# Patient Record
Sex: Female | Born: 1985 | Race: White | Hispanic: No | Marital: Married | Smoking: Current some day smoker
Health system: Southern US, Community
[De-identification: ages and names within clinical notes are randomized; demographics above are authoritative.]

## PROBLEM LIST (undated history)

## (undated) DIAGNOSIS — F419 Anxiety disorder, unspecified: Secondary | ICD-10-CM

## (undated) DIAGNOSIS — K589 Irritable bowel syndrome without diarrhea: Secondary | ICD-10-CM

## (undated) HISTORY — PX: BACK SURGERY: SHX140

## (undated) HISTORY — PX: TONSILLECTOMY: SUR1361

---
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
## 2007-05-18 ENCOUNTER — Ambulatory Visit: Payer: Self-pay | Admitting: Pediatrics

## 2007-06-07 ENCOUNTER — Ambulatory Visit: Payer: Self-pay | Admitting: Pediatrics

## 2007-06-09 ENCOUNTER — Ambulatory Visit: Payer: Self-pay | Admitting: Pediatrics

## 2007-07-06 ENCOUNTER — Ambulatory Visit: Payer: Self-pay | Admitting: Pediatrics

## 2008-05-18 ENCOUNTER — Ambulatory Visit: Payer: Self-pay | Admitting: Pediatrics

## 2008-09-05 ENCOUNTER — Ambulatory Visit: Payer: Self-pay | Admitting: Internal Medicine

## 2008-11-02 ENCOUNTER — Ambulatory Visit: Payer: Self-pay | Admitting: Internal Medicine

## 2009-06-20 ENCOUNTER — Encounter: Payer: Self-pay | Admitting: Internal Medicine

## 2009-07-20 ENCOUNTER — Ambulatory Visit: Payer: Self-pay | Admitting: Internal Medicine

## 2009-07-20 DIAGNOSIS — R002 Palpitations: Secondary | ICD-10-CM | POA: Insufficient documentation

## 2009-07-23 ENCOUNTER — Ambulatory Visit: Payer: Self-pay

## 2009-07-25 ENCOUNTER — Encounter: Payer: Self-pay | Admitting: Internal Medicine

## 2009-07-25 ENCOUNTER — Ambulatory Visit: Payer: Self-pay

## 2009-07-27 ENCOUNTER — Telehealth: Payer: Self-pay | Admitting: Internal Medicine

## 2009-08-20 ENCOUNTER — Ambulatory Visit: Payer: Self-pay | Admitting: Internal Medicine

## 2010-03-25 ENCOUNTER — Emergency Department (HOSPITAL_COMMUNITY): Admission: EM | Admit: 2010-03-25 | Discharge: 2010-03-25 | Payer: Self-pay | Admitting: Family Medicine

## 2012-04-19 ENCOUNTER — Emergency Department (HOSPITAL_COMMUNITY): Payer: 59

## 2012-04-19 ENCOUNTER — Encounter (HOSPITAL_COMMUNITY): Payer: Self-pay | Admitting: *Deleted

## 2012-04-19 ENCOUNTER — Emergency Department (HOSPITAL_COMMUNITY)
Admission: EM | Admit: 2012-04-19 | Discharge: 2012-04-19 | Disposition: A | Discharge status: Home / Self Care | Payer: 59 | Attending: Emergency Medicine | Admitting: Emergency Medicine

## 2012-04-19 DIAGNOSIS — S060X1A Concussion with loss of consciousness of 30 minutes or less, initial encounter: Secondary | ICD-10-CM | POA: Insufficient documentation

## 2012-04-19 DIAGNOSIS — S060X0A Concussion without loss of consciousness, initial encounter: Secondary | ICD-10-CM

## 2012-04-19 DIAGNOSIS — H9209 Otalgia, unspecified ear: Secondary | ICD-10-CM | POA: Insufficient documentation

## 2012-04-19 DIAGNOSIS — R51 Headache: Secondary | ICD-10-CM | POA: Insufficient documentation

## 2012-04-19 DIAGNOSIS — R42 Dizziness and giddiness: Secondary | ICD-10-CM | POA: Insufficient documentation

## 2012-04-19 DIAGNOSIS — R55 Syncope and collapse: Secondary | ICD-10-CM

## 2012-04-19 DIAGNOSIS — R296 Repeated falls: Secondary | ICD-10-CM | POA: Insufficient documentation

## 2012-04-19 DIAGNOSIS — Z79899 Other long term (current) drug therapy: Secondary | ICD-10-CM | POA: Insufficient documentation

## 2012-04-19 DIAGNOSIS — M542 Cervicalgia: Secondary | ICD-10-CM | POA: Insufficient documentation

## 2012-04-19 LAB — CBC
HCT: 37.6 % (ref 36.0–46.0)
MCHC: 34.3 g/dL (ref 30.0–36.0)
MCV: 91 fL (ref 78.0–100.0)
Platelets: 334 10*3/uL (ref 150–400)
RDW: 12.3 % (ref 11.5–15.5)
WBC: 11.8 10*3/uL — ABNORMAL HIGH (ref 4.0–10.5)

## 2012-04-19 LAB — DIFFERENTIAL
Basophils Absolute: 0.1 10*3/uL (ref 0.0–0.1)
Basophils Relative: 1 % (ref 0–1)
Eosinophils Relative: 3 % (ref 0–5)
Monocytes Absolute: 0.9 10*3/uL (ref 0.1–1.0)
Neutro Abs: 5.7 10*3/uL (ref 1.7–7.7)

## 2012-04-19 LAB — POCT I-STAT, CHEM 8
Calcium, Ion: 1.22 mmol/L (ref 1.12–1.32)
HCT: 40 % (ref 36.0–46.0)
Sodium: 140 mEq/L (ref 135–145)
TCO2: 23 mmol/L (ref 0–100)

## 2012-04-19 NOTE — ED Notes (Signed)
Pt. Was standing in the driveway Friday and felt weak and passed out hitting her head on the car.  Pt. ahs c/o head and neck pain.  Pt. Reports feeling nauseated, weak, and light headed.  Pt. reports having headaches and having light sensitivity.

## 2012-04-19 NOTE — ED Provider Notes (Signed)
History     CSN: [id number]  Arrival date & time 04/19/12  1533   None     Chief Complaint  Patient presents with  . Loss of Consciousness  . Loss of Vision  . Fall  . Otalgia    (Consider location/radiation/quality/duration/timing/severity/associated sxs/prior treatment) HPI Comments: Patient states she had a syncopal episode on Friday.  That lasted several seconds, but she fell to the right side, hitting her.  Her head just behind the ear on her car and falling to the ground, landing on her right side.  The remainder of the weekend.  She is to spelled slightly off kilter without nausea.  No further episodes of syncope.  She states that for the past year.  She said several near syncopal episodes, where she's had a roaring in her ears, and developed slight tunnel vision, but these  resolved spontaneously.  She does take propanolol 10 mg daily, but this is not for cardiac reasons, but as a result of a reaction to a previous medication for depression, has been taking this for several years.  Other than having Harrington rods in her back for scoliosis.  She has no medical problems other than depression  Patient is a 26 y.o. female presenting with syncope, fall, and ear pain. The history is provided by the patient.  Loss of Consciousness This is a new problem. The current episode started in the past 7 days. The problem occurs constantly. The problem has been unchanged. Associated symptoms include headaches and neck pain. Pertinent negatives include no chills, fever, numbness, vertigo, visual change or weakness.  Fall Associated symptoms include headaches. Pertinent negatives include no visual change, no fever and no numbness.  Otalgia Associated symptoms include headaches and neck pain.    History reviewed. No pertinent past medical history.  History reviewed. No pertinent past surgical history.  History reviewed. No pertinent family history.  History  Substance Use Topics  . Smoking  status: Not on file  . Smokeless tobacco: Not on file  . Alcohol Use: No    OB History    Grav Para Term Preterm Abortions TAB SAB Ect Mult Living                  Review of Systems  Constitutional: Negative for fever and chills.  HENT: Positive for ear pain and neck pain. Negative for facial swelling and neck stiffness.   Cardiovascular: Positive for syncope.  Genitourinary: Negative for dysuria and menstrual problem.  Neurological: Positive for dizziness, syncope and headaches. Negative for vertigo, tremors, speech difficulty, weakness, light-headedness and numbness.    Allergies  Viibryd  Home Medications   Current Outpatient Rx  Name Route Sig Dispense Refill  . NORETHIN ACE-ETH ESTRAD-FE 1-20 MG-MCG PO TABS Oral Take 1 tablet by mouth at bedtime.    Marland Kitchen PANTOPRAZOLE SODIUM 40 MG PO TBEC Oral Take 40 mg by mouth daily.    Marland Kitchen PROPRANOLOL HCL 10 MG PO TABS Oral Take 10 mg by mouth daily.      BP 134/71  Pulse 61  Temp(Src) 98.3 F (36.8 C) (Oral)  Resp 16  SpO2 99%  LMP 04/16/2012  Physical Exam  Constitutional: She is oriented to person, place, and time. She appears well-developed.  HENT:  Head: Normocephalic and atraumatic.  Left Ear: External ear normal.  Nose: Nose normal.  Mouth/Throat: Oropharynx is clear and moist.       No bruising in the right mastoid area at the point of impact  Eyes:  Pupils are equal, round, and reactive to light.  Neck: Normal range of motion.  Cardiovascular: Normal rate.   Pulmonary/Chest: Effort normal.  Musculoskeletal: Normal range of motion.  Neurological: She is alert and oriented to person, place, and time.  Skin: Skin is warm and dry.    ED Course  Procedures (including critical care time)  Labs Reviewed  CBC - Abnormal; Notable for the following:    WBC 11.8 (*)    All other components within normal limits  DIFFERENTIAL - Abnormal; Notable for the following:    Lymphs Abs 4.8 (*)    All other components within  normal limits  POCT I-STAT, CHEM 8   Ct Head Wo Contrast  04/19/2012  *RADIOLOGY REPORT*  Clinical Data: Syncope, head trauma.  Neck pain.  CT HEAD WITHOUT CONTRAST,CT CERVICAL SPINE WITHOUT CONTRAST  Technique:  Contiguous axial images were obtained from the base of the skull through the vertex without contrast.,Technique: Multidetector CT imaging of the cervical spine was performed. Multiplanar CT image reconstructions were also generated.  Comparison: None.  Findings:  Head: There is no evidence for acute hemorrhage, hydrocephalus, mass lesion, or abnormal extra-axial fluid collection.  No definite CT evidence for acute infarction.  The visualized paranasal sinuses and mastoid air cells are predominately clear.  No displaced calvarial fracture.  Cervical spine:  Lung apices are clear.  Maintained craniocervical relationship.  Loss of normal cervical lordosis.  Maintained vertebral body height.  No prevertebral soft tissue swelling.  No aggressive osseous lesion.  Mild leftward curvature.  Maintained craniocervical relationship and C1-2 articulation. Harrington rods noted on the scout radiograph.  IMPRESSION: No acute intracranial abnormality.  Loss of normal cervical lordosis is likely positional or secondary to muscle spasm.  No acute cervical spine fracture or dislocation identified.  Original Report Authenticated By: Waneta Martins, M.D.   Ct Cervical Spine Wo Contrast  04/19/2012  *RADIOLOGY REPORT*  Clinical Data: Syncope, head trauma.  Neck pain.  CT HEAD WITHOUT CONTRAST,CT CERVICAL SPINE WITHOUT CONTRAST  Technique:  Contiguous axial images were obtained from the base of the skull through the vertex without contrast.,Technique: Multidetector CT imaging of the cervical spine was performed. Multiplanar CT image reconstructions were also generated.  Comparison: None.  Findings:  Head: There is no evidence for acute hemorrhage, hydrocephalus, mass lesion, or abnormal extra-axial fluid collection.   No definite CT evidence for acute infarction.  The visualized paranasal sinuses and mastoid air cells are predominately clear.  No displaced calvarial fracture.  Cervical spine:  Lung apices are clear.  Maintained craniocervical relationship.  Loss of normal cervical lordosis.  Maintained vertebral body height.  No prevertebral soft tissue swelling.  No aggressive osseous lesion.  Mild leftward curvature.  Maintained craniocervical relationship and C1-2 articulation. Harrington rods noted on the scout radiograph.  IMPRESSION: No acute intracranial abnormality.  Loss of normal cervical lordosis is likely positional or secondary to muscle spasm.  No acute cervical spine fracture or dislocation identified.  Original Report Authenticated By: Waneta Martins, M.D.     1. Head injury, closed, with concussion, initial encounter   2. Syncope       MDM  Syncopal episode, resulting in a minor head injury.  Will CT head and neck T2 develop enough, nausea, and feeling ataxic.  Today, she did contact her primary care physician, Dr. Andi Devon, who sent her to the emergency room for evaluation of the syncope, as well as the head injury.  I will also obtain C.  i-STAT, and EKG        Arman Filter, NP 04/19/12 2224

## 2012-04-19 NOTE — ED Notes (Signed)
D/c instructions reviewed w/ pt and family - pt and family deny any further questions or concerns at present. Pt ambulating independently w/ steady gait on d/c in no acute distress - A&Ox4.

## 2012-04-19 NOTE — ED Provider Notes (Signed)
Medical screening examination/treatment/procedure(s) were performed by non-physician practitioner and as supervising physician I was immediately available for consultation/collaboration.  Emmah Bratcher, MD 04/19/12 2322 

## 2012-04-19 NOTE — ED Notes (Signed)
Pt presents w/ HA and neck pain s/p syncopal episode on Friday to which pt hit her head on her car. Pt w/ nausea and light-sensitivity as well. Pt in no acute distress on assessment, A&Ox4 - pleasant and cooperative.

## 2012-04-19 NOTE — Discharge Instructions (Signed)
A head CT scan does not reveal any obvious brain injury or skull fractures, you or walker for syncope.  Is negative with normal electrolytes, and EKG, please followup with Dr. Clelia Croft.  Next week.  We have also been given instructions on concussion and brain injury as you do have a mild concussion as a result of her fall

## 2012-04-19 NOTE — ED Notes (Signed)
Patient transported to CT 

## 2013-04-27 ENCOUNTER — Other Ambulatory Visit: Payer: Self-pay | Admitting: Orthopedic Surgery

## 2013-04-27 DIAGNOSIS — R609 Edema, unspecified: Secondary | ICD-10-CM

## 2013-04-27 DIAGNOSIS — R531 Weakness: Secondary | ICD-10-CM

## 2013-04-27 DIAGNOSIS — M25572 Pain in left ankle and joints of left foot: Secondary | ICD-10-CM

## 2013-05-01 ENCOUNTER — Ambulatory Visit
Admission: RE | Admit: 2013-05-01 | Discharge: 2013-05-01 | Disposition: A | Discharge status: Home / Self Care | Payer: 59 | Source: Ambulatory Visit | Attending: Orthopedic Surgery | Admitting: Orthopedic Surgery

## 2013-05-01 DIAGNOSIS — R531 Weakness: Secondary | ICD-10-CM

## 2013-05-01 DIAGNOSIS — R609 Edema, unspecified: Secondary | ICD-10-CM

## 2013-05-01 DIAGNOSIS — M25572 Pain in left ankle and joints of left foot: Secondary | ICD-10-CM

## 2014-06-19 ENCOUNTER — Emergency Department (HOSPITAL_COMMUNITY): Payer: BC Managed Care – PPO

## 2014-06-19 ENCOUNTER — Encounter (HOSPITAL_COMMUNITY): Payer: Self-pay | Admitting: Emergency Medicine

## 2014-06-19 ENCOUNTER — Emergency Department (HOSPITAL_COMMUNITY)
Admission: EM | Admit: 2014-06-19 | Discharge: 2014-06-19 | Disposition: A | Discharge status: Home / Self Care | Payer: BC Managed Care – PPO | Attending: Emergency Medicine | Admitting: Emergency Medicine

## 2014-06-19 DIAGNOSIS — Z79899 Other long term (current) drug therapy: Secondary | ICD-10-CM | POA: Insufficient documentation

## 2014-06-19 DIAGNOSIS — Z792 Long term (current) use of antibiotics: Secondary | ICD-10-CM | POA: Insufficient documentation

## 2014-06-19 DIAGNOSIS — Z3202 Encounter for pregnancy test, result negative: Secondary | ICD-10-CM | POA: Insufficient documentation

## 2014-06-19 DIAGNOSIS — R1031 Right lower quadrant pain: Secondary | ICD-10-CM

## 2014-06-19 DIAGNOSIS — R110 Nausea: Secondary | ICD-10-CM | POA: Insufficient documentation

## 2014-06-19 DIAGNOSIS — K589 Irritable bowel syndrome without diarrhea: Secondary | ICD-10-CM | POA: Insufficient documentation

## 2014-06-19 DIAGNOSIS — Z8659 Personal history of other mental and behavioral disorders: Secondary | ICD-10-CM | POA: Insufficient documentation

## 2014-06-19 DIAGNOSIS — F17200 Nicotine dependence, unspecified, uncomplicated: Secondary | ICD-10-CM | POA: Insufficient documentation

## 2014-06-19 HISTORY — DX: Irritable bowel syndrome, unspecified: K58.9

## 2014-06-19 HISTORY — DX: Anxiety disorder, unspecified: F41.9

## 2014-06-19 LAB — COMPREHENSIVE METABOLIC PANEL
ALBUMIN: 4.6 g/dL (ref 3.5–5.2)
ALT: 12 U/L (ref 0–35)
ANION GAP: 15 (ref 5–15)
AST: 19 U/L (ref 0–37)
Alkaline Phosphatase: 68 U/L (ref 39–117)
BILIRUBIN TOTAL: 0.3 mg/dL (ref 0.3–1.2)
BUN: 16 mg/dL (ref 6–23)
CO2: 25 mEq/L (ref 19–32)
CREATININE: 0.9 mg/dL (ref 0.50–1.10)
Calcium: 10 mg/dL (ref 8.4–10.5)
Chloride: 98 mEq/L (ref 96–112)
GFR calc non Af Amer: 87 mL/min — ABNORMAL LOW (ref >90)
GLUCOSE: 100 mg/dL — AB (ref 70–99)
Potassium: 4.2 mEq/L (ref 3.7–5.3)
Sodium: 138 mEq/L (ref 137–147)
TOTAL PROTEIN: 8 g/dL (ref 6.0–8.3)

## 2014-06-19 LAB — URINALYSIS, ROUTINE W REFLEX MICROSCOPIC
Bilirubin Urine: NEGATIVE
Glucose, UA: NEGATIVE mg/dL
HGB URINE DIPSTICK: NEGATIVE
KETONES UR: NEGATIVE mg/dL
Leukocytes, UA: NEGATIVE
Nitrite: NEGATIVE
PROTEIN: NEGATIVE mg/dL
Specific Gravity, Urine: 1.008 (ref 1.005–1.030)
UROBILINOGEN UA: 0.2 mg/dL (ref 0.0–1.0)
pH: 6 (ref 5.0–8.0)

## 2014-06-19 LAB — CBC WITH DIFFERENTIAL/PLATELET
BASOS PCT: 1 % (ref 0–1)
Basophils Absolute: 0.1 10*3/uL (ref 0.0–0.1)
EOS ABS: 0.3 10*3/uL (ref 0.0–0.7)
EOS PCT: 2 % (ref 0–5)
HEMATOCRIT: 40.9 % (ref 36.0–46.0)
HEMOGLOBIN: 13.7 g/dL (ref 12.0–15.0)
LYMPHS ABS: 3.7 10*3/uL (ref 0.7–4.0)
Lymphocytes Relative: 28 % (ref 12–46)
MCH: 31.9 pg (ref 26.0–34.0)
MCHC: 33.5 g/dL (ref 30.0–36.0)
MCV: 95.3 fL (ref 78.0–100.0)
MONO ABS: 1.2 10*3/uL — AB (ref 0.1–1.0)
MONOS PCT: 9 % (ref 3–12)
Neutro Abs: 8 10*3/uL — ABNORMAL HIGH (ref 1.7–7.7)
Neutrophils Relative %: 60 % (ref 43–77)
Platelets: 349 10*3/uL (ref 150–400)
RBC: 4.29 MIL/uL (ref 3.87–5.11)
RDW: 13.1 % (ref 11.5–15.5)
WBC: 13.2 10*3/uL — ABNORMAL HIGH (ref 4.0–10.5)

## 2014-06-19 LAB — POC URINE PREG, ED: Preg Test, Ur: NEGATIVE

## 2014-06-19 LAB — LIPASE, BLOOD: LIPASE: 35 U/L (ref 11–59)

## 2014-06-19 NOTE — ED Notes (Signed)
Bed: WA09 Expected date:  Expected time:  Means of arrival:  Comments: EMS 

## 2014-06-19 NOTE — ED Provider Notes (Signed)
CSN: [id number]     Arrival date & time 06/19/14  1832 History   First MD Initiated Contact with Patient 06/19/14 2002     Chief Complaint  Patient presents with  . Abdominal Pain     (Consider location/radiation/quality/duration/timing/severity/associated sxs/prior Treatment) HPI Comments: Patient presents with abdominal pain. She states that she has not had a period in about 3 months. She saw her OB/GYN about 2-3 weeks ago and had a pelvic exam which she said was unremarkable. She was started on Prometrium to help restart her periods. She states that she still hasn't started.  She has a two-day history of some pain in her lower abdomen. She describes as a dull ache in her right lower abdomen. It radiates to her groin and into her back. It seems to come in waves. She has had some nausea but no vomiting. She denies any fevers. She denies any urinary symptoms. She takes an ibuprofen which did improve his symptoms but did not take them away.   Past Medical History  Diagnosis Date  . IBS (irritable bowel syndrome)   . Spastic colon   . Anxiety    Past Surgical History  Procedure Laterality Date  . Tonsillectomy    . Back surgery      scoliosis repair   No family history on file. History  Substance Use Topics  . Smoking status: Current Some Day Smoker    Types: Cigarettes  . Smokeless tobacco: Not on file  . Alcohol Use: Yes     Comment: socially   OB History   Grav Para Term Preterm Abortions TAB SAB Ect Mult Living                 Review of Systems  Constitutional: Negative for fever, chills, diaphoresis and fatigue.  HENT: Negative for congestion, rhinorrhea and sneezing.   Eyes: Negative.   Respiratory: Negative for cough, chest tightness and shortness of breath.   Cardiovascular: Negative for chest pain and leg swelling.  Gastrointestinal: Positive for nausea and abdominal pain. Negative for vomiting, diarrhea and blood in stool.  Genitourinary: Negative for frequency,  hematuria, flank pain, vaginal bleeding, vaginal discharge and difficulty urinating.  Musculoskeletal: Negative for arthralgias and back pain.  Skin: Negative for rash.  Neurological: Negative for dizziness, speech difficulty, weakness, numbness and headaches.      Allergies  Viibryd  Home Medications   Prior to Admission medications   Medication Sig Start Date End Date Taking? Authorizing Provider  amoxicillin (AMOXIL) 500 MG capsule Take 500 mg by mouth 3 (three) times daily.   Yes Historical Provider, MD  hyoscyamine (LEVSIN SL) 0.125 MG SL tablet Place 0.125-0.25 mg under the tongue every 4 (four) hours as needed for cramping.   Yes Historical Provider, MD  pantoprazole (PROTONIX) 20 MG tablet Take 20 mg by mouth daily.   Yes Historical Provider, MD  spironolactone (ALDACTONE) 100 MG tablet Take 100 mg by mouth daily. 06/13/14   Historical Provider, MD   BP 107/56  Pulse 59  Temp(Src) 98.4 F (36.9 C) (Oral)  Resp 18  Ht 5\' 7"  (1.702 m)  Wt 165 lb (74.844 kg)  BMI 25.84 kg/m2  SpO2 100%  LMP 03/13/2014 Physical Exam  Constitutional: She is oriented to person, place, and time. She appears well-developed and well-nourished.  HENT:  Head: Normocephalic and atraumatic.  Eyes: Pupils are equal, round, and reactive to light.  Neck: Normal range of motion. Neck supple.  Cardiovascular: Normal rate, regular rhythm and normal  heart sounds.   Pulmonary/Chest: Effort normal and breath sounds normal. No respiratory distress. She has no wheezes. She has no rales. She exhibits no tenderness.  Abdominal: Soft. Bowel sounds are normal. There is tenderness (mild tenderness to the right lower abdomen, more in the pelvic area. No specific pain over McBurney's point. No rebound no guarding. No peritoneal  signs.). There is no rebound and no guarding.  Genitourinary:  Positive right adnexal tenderness. No cervical motion tenderness. There is some dark red blood in in the vault. No active  bleeding. No discharge.  Musculoskeletal: Normal range of motion. She exhibits no edema.  Lymphadenopathy:    She has no cervical adenopathy.  Neurological: She is alert and oriented to person, place, and time.  Skin: Skin is warm and dry. No rash noted.  Psychiatric: She has a normal mood and affect.    ED Course  Procedures (including critical care time) Labs Review Results for orders placed during the hospital encounter of 06/19/14  CBC WITH DIFFERENTIAL      Result Value Ref Range   WBC 13.2 (*) 4.0 - 10.5 K/uL   RBC 4.29  3.87 - 5.11 MIL/uL   Hemoglobin 13.7  12.0 - 15.0 g/dL   HCT 16.1  09.6 - 04.5 %   MCV 95.3  78.0 - 100.0 fL   MCH 31.9  26.0 - 34.0 pg   MCHC 33.5  30.0 - 36.0 g/dL   RDW 40.9  81.1 - 91.4 %   Platelets 349  150 - 400 K/uL   Neutrophils Relative % 60  43 - 77 %   Neutro Abs 8.0 (*) 1.7 - 7.7 K/uL   Lymphocytes Relative 28  12 - 46 %   Lymphs Abs 3.7  0.7 - 4.0 K/uL   Monocytes Relative 9  3 - 12 %   Monocytes Absolute 1.2 (*) 0.1 - 1.0 K/uL   Eosinophils Relative 2  0 - 5 %   Eosinophils Absolute 0.3  0.0 - 0.7 K/uL   Basophils Relative 1  0 - 1 %   Basophils Absolute 0.1  0.0 - 0.1 K/uL  COMPREHENSIVE METABOLIC PANEL      Result Value Ref Range   Sodium 138  137 - 147 mEq/L   Potassium 4.2  3.7 - 5.3 mEq/L   Chloride 98  96 - 112 mEq/L   CO2 25  19 - 32 mEq/L   Glucose, Bld 100 (*) 70 - 99 mg/dL   BUN 16  6 - 23 mg/dL   Creatinine, Ser 7.82  0.50 - 1.10 mg/dL   Calcium 95.6  8.4 - 21.3 mg/dL   Total Protein 8.0  6.0 - 8.3 g/dL   Albumin 4.6  3.5 - 5.2 g/dL   AST 19  0 - 37 U/L   ALT 12  0 - 35 U/L   Alkaline Phosphatase 68  39 - 117 U/L   Total Bilirubin 0.3  0.3 - 1.2 mg/dL   GFR calc non Af Amer 87 (*) >90 mL/min   GFR calc Af Amer >90  >90 mL/min   Anion gap 15  5 - 15  LIPASE, BLOOD      Result Value Ref Range   Lipase 35  11 - 59 U/L  URINALYSIS, ROUTINE W REFLEX MICROSCOPIC      Result Value Ref Range   Color, Urine YELLOW   YELLOW   APPearance CLOUDY (*) CLEAR   Specific Gravity, Urine 1.008  1.005 - 1.030  pH 6.0  5.0 - 8.0   Glucose, UA NEGATIVE  NEGATIVE mg/dL   Hgb urine dipstick NEGATIVE  NEGATIVE   Bilirubin Urine NEGATIVE  NEGATIVE   Ketones, ur NEGATIVE  NEGATIVE mg/dL   Protein, ur NEGATIVE  NEGATIVE mg/dL   Urobilinogen, UA 0.2  0.0 - 1.0 mg/dL   Nitrite NEGATIVE  NEGATIVE   Leukocytes, UA NEGATIVE  NEGATIVE  POC URINE PREG, ED      Result Value Ref Range   Preg Test, Ur NEGATIVE  NEGATIVE   [place] Transvaginal Non-ob  06/19/2014   CLINICAL DATA:  Right lower quadrant abdominal pain. LMP 3 months ago. Negative pregnancy test.  EXAM: TRANSABDOMINAL AND TRANSVAGINAL ULTRASOUND OF PELVIS  DOPPLER ULTRASOUND OF OVARIES  TECHNIQUE: Both transabdominal and transvaginal ultrasound examinations of the pelvis were performed. Transabdominal technique was performed for global imaging of the pelvis including uterus, ovaries, adnexal regions, and pelvic cul-de-sac.  It was necessary to proceed with endovaginal exam following the transabdominal exam to visualize the endometrium and ovaries. Color and duplex Doppler ultrasound was utilized to evaluate blood flow to the ovaries.  COMPARISON:  None.  FINDINGS: Uterus  Measurements: 6.3 x 2.6 x 3.9 cm. No fibroids or other mass visualized.  Endometrium  Thickness: 3.4 mm.  No focal abnormality visualized.  Right ovary  Measurements: 4.5 x 1.5 x 1.9 cm. Multiple follicles noted without focal cyst or mass.  Left ovary  Measurements: 2.5 x 2.1 x 2.3 cm. Multiple follicles noted without focal cyst or mass.  Pulsed Doppler evaluation of both ovaries demonstrates normal low-resistance arterial and venous waveforms.  Other findings  No free fluid.  IMPRESSION: No acute findings.  No evidence of ovarian torsion.   Electronically Signed   By: Roxy HorsemanBill  Veazey M.D.   On: 06/19/2014 22:50   [place] Pelvis Complete  06/19/2014   CLINICAL DATA:  Right lower quadrant abdominal pain. LMP 3 months  ago. Negative pregnancy test.  EXAM: TRANSABDOMINAL AND TRANSVAGINAL ULTRASOUND OF PELVIS  DOPPLER ULTRASOUND OF OVARIES  TECHNIQUE: Both transabdominal and transvaginal ultrasound examinations of the pelvis were performed. Transabdominal technique was performed for global imaging of the pelvis including uterus, ovaries, adnexal regions, and pelvic cul-de-sac.  It was necessary to proceed with endovaginal exam following the transabdominal exam to visualize the endometrium and ovaries. Color and duplex Doppler ultrasound was utilized to evaluate blood flow to the ovaries.  COMPARISON:  None.  FINDINGS: Uterus  Measurements: 6.3 x 2.6 x 3.9 cm. No fibroids or other mass visualized.  Endometrium  Thickness: 3.4 mm.  No focal abnormality visualized.  Right ovary  Measurements: 4.5 x 1.5 x 1.9 cm. Multiple follicles noted without focal cyst or mass.  Left ovary  Measurements: 2.5 x 2.1 x 2.3 cm. Multiple follicles noted without focal cyst or mass.  Pulsed Doppler evaluation of both ovaries demonstrates normal low-resistance arterial and venous waveforms.  Other findings  No free fluid.  IMPRESSION: No acute findings.  No evidence of ovarian torsion.   Electronically Signed   By: Roxy HorsemanBill  Veazey M.D.   On: 06/19/2014 22:50   [place] Art/ven Flow Abd Pelv Doppler  06/19/2014   CLINICAL DATA:  Right lower quadrant abdominal pain. LMP 3 months ago. Negative pregnancy test.  EXAM: TRANSABDOMINAL AND TRANSVAGINAL ULTRASOUND OF PELVIS  DOPPLER ULTRASOUND OF OVARIES  TECHNIQUE: Both transabdominal and transvaginal ultrasound examinations of the pelvis were performed. Transabdominal technique was performed for global imaging of the pelvis including uterus, ovaries, adnexal regions, and pelvic cul-de-sac.  It was necessary to proceed with endovaginal exam following the transabdominal exam to visualize the endometrium and ovaries. Color and duplex Doppler ultrasound was utilized to evaluate blood flow to the ovaries.  COMPARISON:  None.   FINDINGS: Uterus  Measurements: 6.3 x 2.6 x 3.9 cm. No fibroids or other mass visualized.  Endometrium  Thickness: 3.4 mm.  No focal abnormality visualized.  Right ovary  Measurements: 4.5 x 1.5 x 1.9 cm. Multiple follicles noted without focal cyst or mass.  Left ovary  Measurements: 2.5 x 2.1 x 2.3 cm. Multiple follicles noted without focal cyst or mass.  Pulsed Doppler evaluation of both ovaries demonstrates normal low-resistance arterial and venous waveforms.  Other findings  No free fluid.  IMPRESSION: No acute findings.  No evidence of ovarian torsion.   Electronically Signed   By: Roxy Horseman M.D.   On: 06/19/2014 22:50      Imaging Review US Transvaginal Non-ob  06/19/2014   CLINICAL DATA:  Right lower quadrant abdominal pain. LMP 3 months ago. Negative pregnancy test.  EXAM: TRANSABDOMINAL AND TRANSVAGINAL ULTRASOUND OF PELVIS  DOPPLER ULTRASOUND OF OVARIES  TECHNIQUE: Both transabdominal and transvaginal ultrasound examinations of the pelvis were performed. Transabdominal technique was performed for global imaging of the pelvis including uterus, ovaries, adnexal regions, and pelvic cul-de-sac.  It was necessary to proceed with endovaginal exam following the transabdominal exam to visualize the endometrium and ovaries. Color and duplex Doppler ultrasound was utilized to evaluate blood flow to the ovaries.  COMPARISON:  None.  FINDINGS: Uterus  Measurements: 6.3 x 2.6 x 3.9 cm. No fibroids or other mass visualized.  Endometrium  Thickness: 3.4 mm.  No focal abnormality visualized.  Right ovary  Measurements: 4.5 x 1.5 x 1.9 cm. Multiple follicles noted without focal cyst or mass.  Left ovary  Measurements: 2.5 x 2.1 x 2.3 cm. Multiple follicles noted without focal cyst or mass.  Pulsed Doppler evaluation of both ovaries demonstrates normal low-resistance arterial and venous waveforms.  Other findings  No free fluid.  IMPRESSION: No acute findings.  No evidence of ovarian torsion.   Electronically  Signed   By: Roxy Horseman M.D.   On: 06/19/2014 22:50   [place] Pelvis Complete  06/19/2014   CLINICAL DATA:  Right lower quadrant abdominal pain. LMP 3 months ago. Negative pregnancy test.  EXAM: TRANSABDOMINAL AND TRANSVAGINAL ULTRASOUND OF PELVIS  DOPPLER ULTRASOUND OF OVARIES  TECHNIQUE: Both transabdominal and transvaginal ultrasound examinations of the pelvis were performed. Transabdominal technique was performed for global imaging of the pelvis including uterus, ovaries, adnexal regions, and pelvic cul-de-sac.  It was necessary to proceed with endovaginal exam following the transabdominal exam to visualize the endometrium and ovaries. Color and duplex Doppler ultrasound was utilized to evaluate blood flow to the ovaries.  COMPARISON:  None.  FINDINGS: Uterus  Measurements: 6.3 x 2.6 x 3.9 cm. No fibroids or other mass visualized.  Endometrium  Thickness: 3.4 mm.  No focal abnormality visualized.  Right ovary  Measurements: 4.5 x 1.5 x 1.9 cm. Multiple follicles noted without focal cyst or mass.  Left ovary  Measurements: 2.5 x 2.1 x 2.3 cm. Multiple follicles noted without focal cyst or mass.  Pulsed Doppler evaluation of both ovaries demonstrates normal low-resistance arterial and venous waveforms.  Other findings  No free fluid.  IMPRESSION: No acute findings.  No evidence of ovarian torsion.   Electronically Signed   By: Roxy Horseman M.D.   On: 06/19/2014 22:50   [place] Art/ven Flow  Abd Pelv Doppler  06/19/2014   CLINICAL DATA:  Right lower quadrant abdominal pain. LMP 3 months ago. Negative pregnancy test.  EXAM: TRANSABDOMINAL AND TRANSVAGINAL ULTRASOUND OF PELVIS  DOPPLER ULTRASOUND OF OVARIES  TECHNIQUE: Both transabdominal and transvaginal ultrasound examinations of the pelvis were performed. Transabdominal technique was performed for global imaging of the pelvis including uterus, ovaries, adnexal regions, and pelvic cul-de-sac.  It was necessary to proceed with endovaginal exam following the  transabdominal exam to visualize the endometrium and ovaries. Color and duplex Doppler ultrasound was utilized to evaluate blood flow to the ovaries.  COMPARISON:  None.  FINDINGS: Uterus  Measurements: 6.3 x 2.6 x 3.9 cm. No fibroids or other mass visualized.  Endometrium  Thickness: 3.4 mm.  No focal abnormality visualized.  Right ovary  Measurements: 4.5 x 1.5 x 1.9 cm. Multiple follicles noted without focal cyst or mass.  Left ovary  Measurements: 2.5 x 2.1 x 2.3 cm. Multiple follicles noted without focal cyst or mass.  Pulsed Doppler evaluation of both ovaries demonstrates normal low-resistance arterial and venous waveforms.  Other findings  No free fluid.  IMPRESSION: No acute findings.  No evidence of ovarian torsion.   Electronically Signed   By: Roxy Horseman M.D.   On: 06/19/2014 22:50     EKG Interpretation None      MDM   Final diagnoses:  Right lower quadrant abdominal pain    Patient presents with right lower quadrant pain. Her pain seems to be more in the pelvic area than around her appendix. She also has started having some vaginal bleeding. I feel her pain is more likely due to this. I have a lower suspicion for appendicitis. However she does have a mildly elevated white count. She is well-appearing with no peritoneal signs. I did discuss options of doing a CT scan to evaluate her appendix versus 24-hour period of observation. I did advise her the risk and benefits including delayed evaluation of appendicitis and possible rupture versus the radiation exposure due to CT scans. She discussed this with her parents and is deciding to do a 24-hour period of observation. I advised her to followup with her primary care physician tomorrow to have someone reevaluate her abdomen 24 hours. Advised to return here if she has worsening pain vomiting or fevers.    Rolan Bucco, MD 06/19/14 [phone]

## 2014-06-19 NOTE — Discharge Instructions (Signed)
Abdominal Pain, Women °Abdominal (stomach, pelvic, or belly) pain can be caused by many things. It is important to tell your doctor: °· The location of the pain. °· Does it come and go or is it present all the time? °· Are there things that start the pain (eating certain foods, exercise)? °· Are there other symptoms associated with the pain (fever, nausea, vomiting, diarrhea)? °All of this is helpful to know when trying to find the cause of the pain. °CAUSES  °· Stomach: virus or bacteria infection, or ulcer. °· Intestine: appendicitis (inflamed appendix), regional ileitis (Crohn's disease), ulcerative colitis (inflamed colon), irritable bowel syndrome, diverticulitis (inflamed diverticulum of the colon), or cancer of the stomach or intestine. °· Gallbladder disease or stones in the gallbladder. °· Kidney disease, kidney stones, or infection. °· Pancreas infection or cancer. °· Fibromyalgia (pain disorder). °· Diseases of the female organs: °¨ Uterus: fibroid (non-cancerous) tumors or infection. °¨ Fallopian tubes: infection or tubal pregnancy. °¨ Ovary: cysts or tumors. °¨ Pelvic adhesions (scar tissue). °¨ Endometriosis (uterus lining tissue growing in the pelvis and on the pelvic organs). °¨ Pelvic congestion syndrome (female organs filling up with blood just before the menstrual period). °¨ Pain with the menstrual period. °¨ Pain with ovulation (producing an egg). °¨ Pain with an IUD (intrauterine device, birth control) in the uterus. °¨ Cancer of the female organs. °· Functional pain (pain not caused by a disease, may improve without treatment). °· Psychological pain. °· Depression. °DIAGNOSIS  °Your doctor will decide the seriousness of your pain by doing an examination. °· Blood tests. °· X-rays. °· Ultrasound. °· CT scan (computed tomography, special type of X-ray). °· MRI (magnetic resonance imaging). °· Cultures, for infection. °· Barium enema (dye inserted in the large intestine, to better view it with  X-rays). °· Colonoscopy (looking in intestine with a lighted tube). °· Laparoscopy (minor surgery, looking in abdomen with a lighted tube). °· Major abdominal exploratory surgery (looking in abdomen with a large incision). °TREATMENT  °The treatment will depend on the cause of the pain.  °· Many cases can be observed and treated at home. °· Over-the-counter medicines recommended by your caregiver. °· Prescription medicine. °· Antibiotics, for infection. °· Birth control pills, for painful periods or for ovulation pain. °· Hormone treatment, for endometriosis. °· Nerve blocking injections. °· Physical therapy. °· Antidepressants. °· Counseling with a psychologist or psychiatrist. °· Minor or major surgery. °HOME CARE INSTRUCTIONS  °· Do not take laxatives, unless directed by your caregiver. °· Take over-the-counter pain medicine only if ordered by your caregiver. Do not take aspirin because it can cause an upset stomach or bleeding. °· Try a clear liquid diet (broth or water) as ordered by your caregiver. Slowly move to a bland diet, as tolerated, if the pain is related to the stomach or intestine. °· Have a thermometer and take your temperature several times a day, and record it. °· Bed rest and sleep, if it helps the pain. °· Avoid sexual intercourse, if it causes pain. °· Avoid stressful situations. °· Keep your follow-up appointments and tests, as your caregiver orders. °· If the pain does not go away with medicine or surgery, you may try: °¨ Acupuncture. °¨ Relaxation exercises (yoga, meditation). °¨ Group therapy. °¨ Counseling. °SEEK MEDICAL CARE IF:  °· You notice certain foods cause stomach pain. °· Your home care treatment is not helping your pain. °· You need stronger pain medicine. °· You want your IUD removed. °· You feel faint or   lightheaded. °· You develop nausea and vomiting. °· You develop a rash. °· You are having side effects or an allergy to your medicine. °SEEK IMMEDIATE MEDICAL CARE IF:  °· Your  pain does not go away or gets worse. °· You have a fever. °· Your pain is felt only in portions of the abdomen. The right side could possibly be appendicitis. The left lower portion of the abdomen could be colitis or diverticulitis. °· You are passing blood in your stools (bright red or black tarry stools, with or without vomiting). °· You have blood in your urine. °· You develop chills, with or without a fever. °· You pass out. °MAKE SURE YOU:  °· Understand these instructions. °· Will watch your condition. °· Will get help right away if you are not doing well or get worse. °Document Released: 09/14/2007 Document Revised: 02/09/2012 Document Reviewed: 10/04/2009 °[employer]® Patient Information ©2015 [employer]. This information is not intended to replace advice given to you by your health care provider. Make sure you discuss any questions you have with your health care provider. ° °

## 2014-06-19 NOTE — ED Notes (Signed)
Pt states that she began having a dull ache to her Rt mid and lower quad yesterday am; pt states that it has progressively gotten worse and now describes it as constant cramping with intermittent sharp pains; pt c/o nausea no vomiting; pt states that she has taken her "colon" medicine and ibuprofen with no improvement; pt states that the pain is causing her increased anxiety as well

## 2014-06-19 NOTE — ED Notes (Signed)
Pt states that she has not had her period in 3 months and saw her OB / GYN 2-3 weeks ago; pt states that the work up was negative ans was placed on Prometrium to restart periods; pt has taken Prometrium for 2 weeks but has not started her period as of yet.

## 2015-07-31 ENCOUNTER — Other Ambulatory Visit: Payer: Self-pay | Admitting: Occupational Medicine

## 2015-07-31 ENCOUNTER — Ambulatory Visit: Payer: Self-pay

## 2015-07-31 DIAGNOSIS — M79672 Pain in left foot: Secondary | ICD-10-CM

## 2016-10-09 ENCOUNTER — Other Ambulatory Visit: Payer: Self-pay | Admitting: Family Medicine

## 2016-10-09 DIAGNOSIS — R42 Dizziness and giddiness: Secondary | ICD-10-CM

## 2016-10-09 DIAGNOSIS — R200 Anesthesia of skin: Secondary | ICD-10-CM

## 2016-10-15 ENCOUNTER — Ambulatory Visit
Admission: RE | Admit: 2016-10-15 | Discharge: 2016-10-15 | Disposition: A | Discharge status: Home / Self Care | Payer: 59 | Source: Ambulatory Visit | Attending: Family Medicine | Admitting: Family Medicine

## 2016-10-15 ENCOUNTER — Other Ambulatory Visit: Payer: Self-pay | Admitting: Family Medicine

## 2016-10-15 DIAGNOSIS — R200 Anesthesia of skin: Secondary | ICD-10-CM

## 2016-10-15 DIAGNOSIS — R42 Dizziness and giddiness: Secondary | ICD-10-CM

## 2016-10-15 MED ORDER — IOPAMIDOL (ISOVUE-300) INJECTION 61%: 75.0000 mL | Freq: Once | INTRAVENOUS | Status: DC | PRN

## 2016-12-09 DIAGNOSIS — L709 Acne, unspecified: Secondary | ICD-10-CM | POA: Diagnosis not present

## 2016-12-09 DIAGNOSIS — R87610 Atypical squamous cells of undetermined significance on cytologic smear of cervix (ASC-US): Secondary | ICD-10-CM | POA: Diagnosis not present

## 2017-02-02 DIAGNOSIS — L700 Acne vulgaris: Secondary | ICD-10-CM | POA: Diagnosis not present

## 2017-03-24 DIAGNOSIS — H66002 Acute suppurative otitis media without spontaneous rupture of ear drum, left ear: Secondary | ICD-10-CM | POA: Diagnosis not present

## 2017-03-27 DIAGNOSIS — H1045 Other chronic allergic conjunctivitis: Secondary | ICD-10-CM | POA: Diagnosis not present

## 2017-04-26 IMAGING — CT CT HEAD W/O CM
1 series · 16 of 30 positions shown, 20 images · non-contrast
Comparison: Head CT scan 04/19/2012.

CLINICAL DATA: Dizziness, anxiety and intermittent headaches for 3
weeks.

EXAM:
CT HEAD WITHOUT CONTRAST
TECHNIQUE: Contiguous axial images were obtained from the base of the skull
through the vertex without intravenous contrast.

[Series 2: head w/(date) · axial · 0.43mm/px · z∈[-261,-126]mm · 16 of 31 slices shown, 20 images]
[im 2/31  brain]
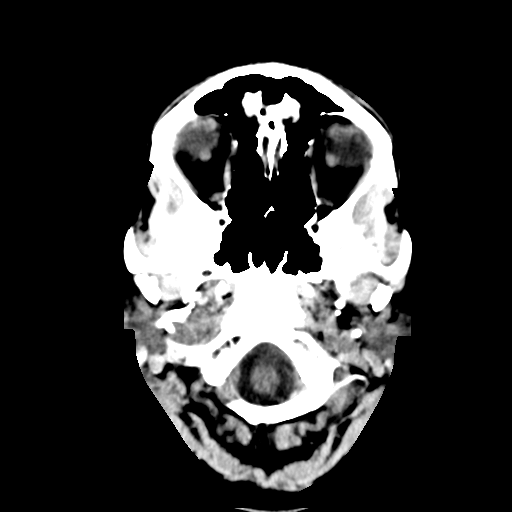
[im 2/31  bone]
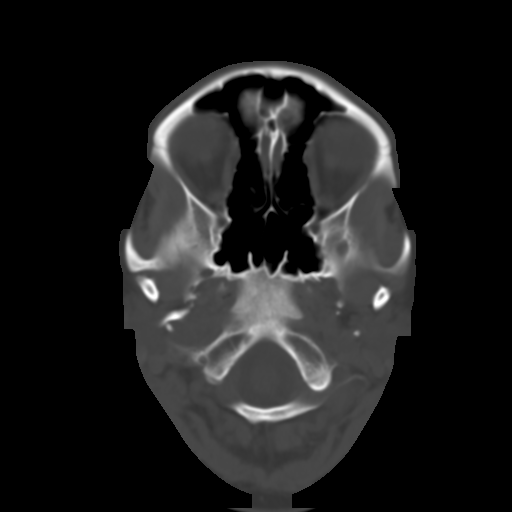
[im 4/31  brain]
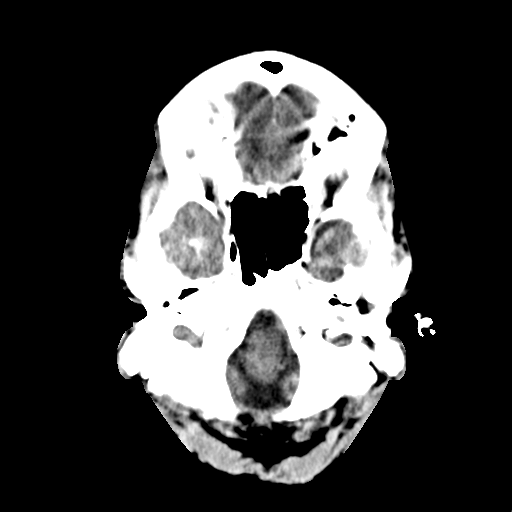
[im 6/31  brain]
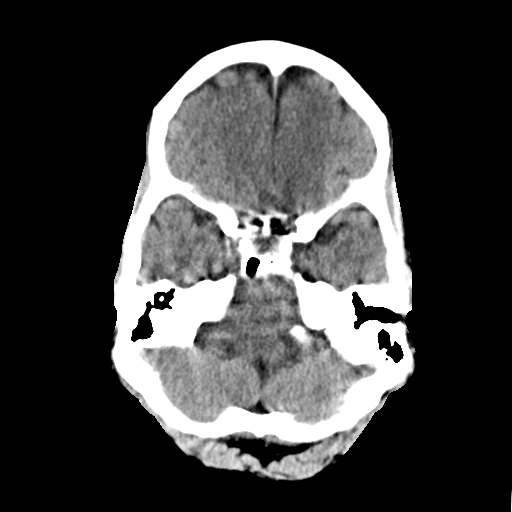
[im 8/31  brain]
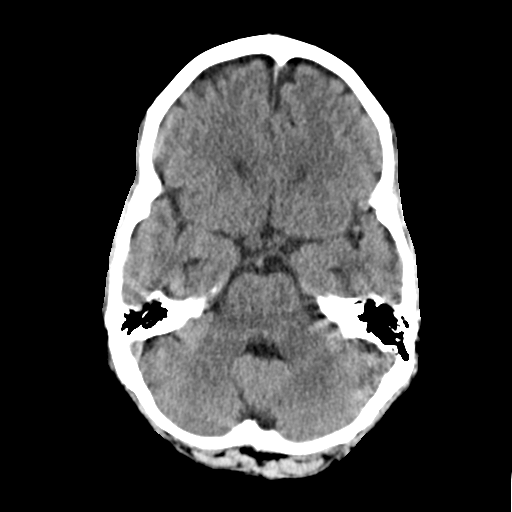
[im 9/31  brain]
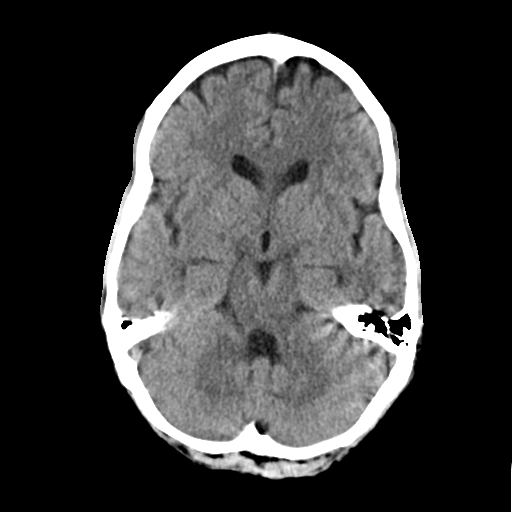
[im 9/31  bone]
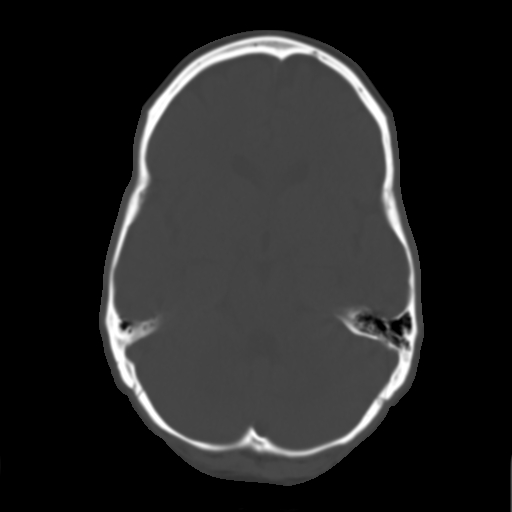
[im 11/31  brain]
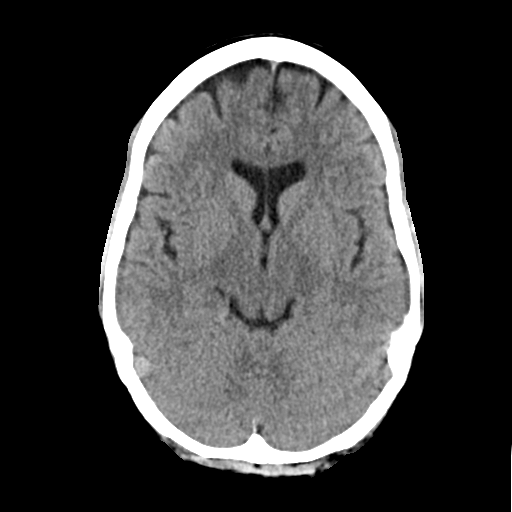
[im 13/31  brain]
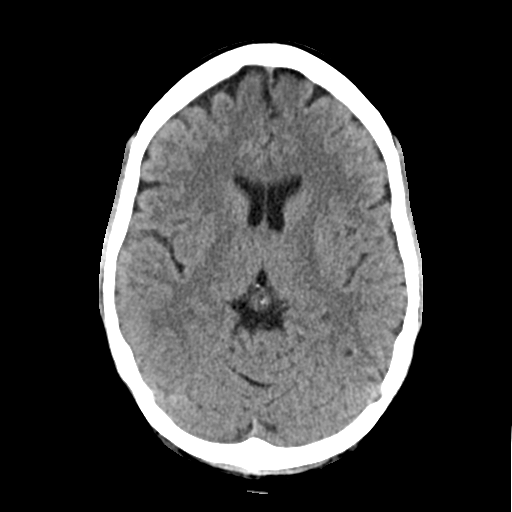
[im 15/31  brain]
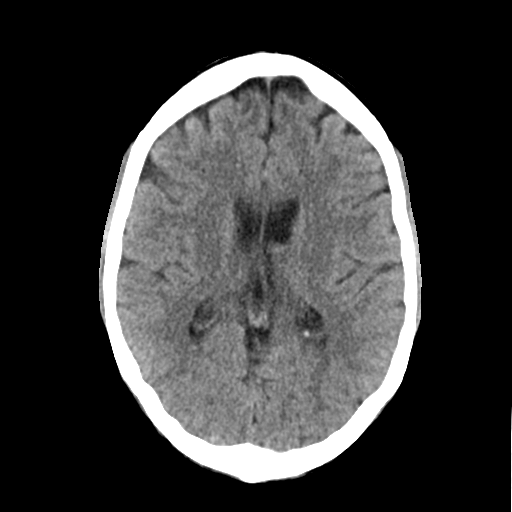
[im 16/31  brain]
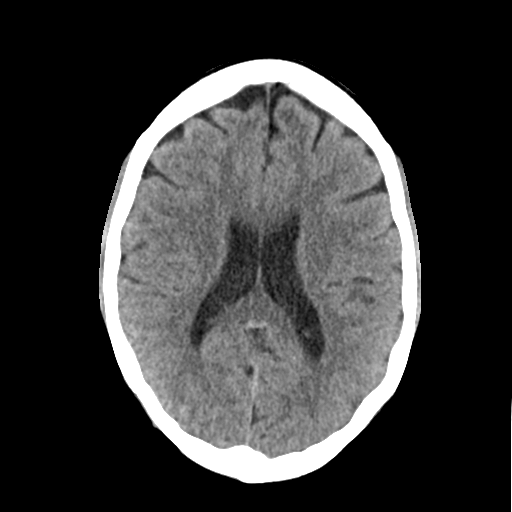
[im 16/31  bone]
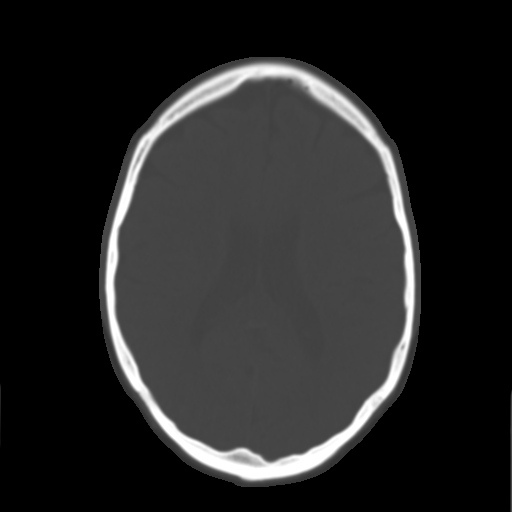
[im 18/31  brain]
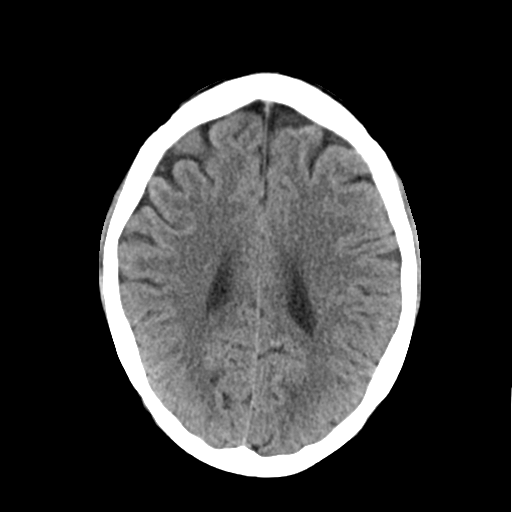
[im 20/31  brain]
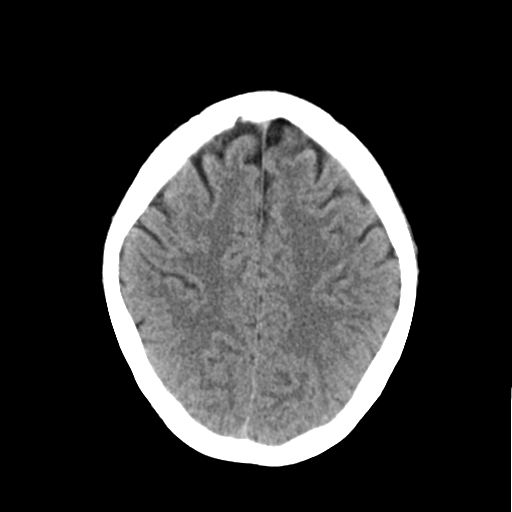
[im 22/31  brain]
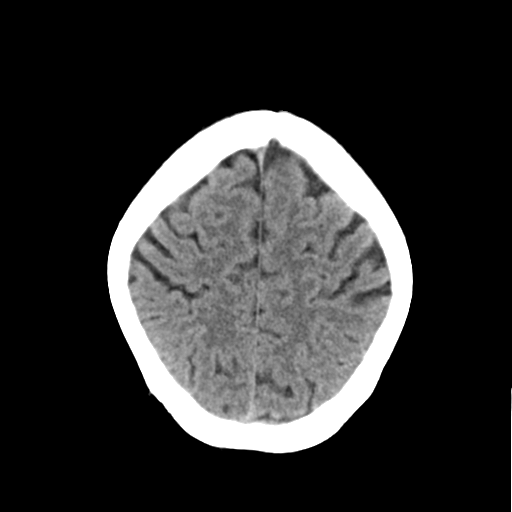
[im 23/31  brain]
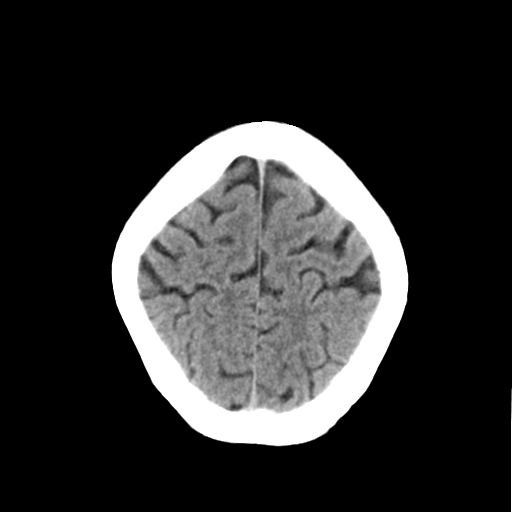
[im 23/31  bone]
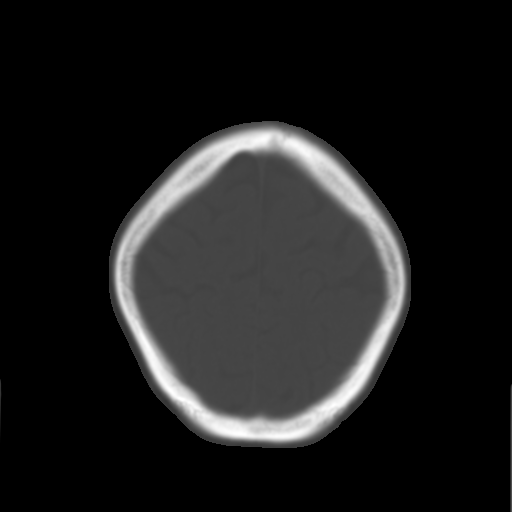
[im 25/31  brain]
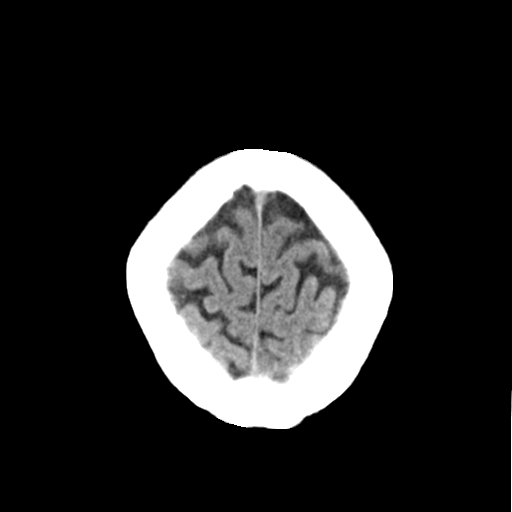
[im 27/31  brain]
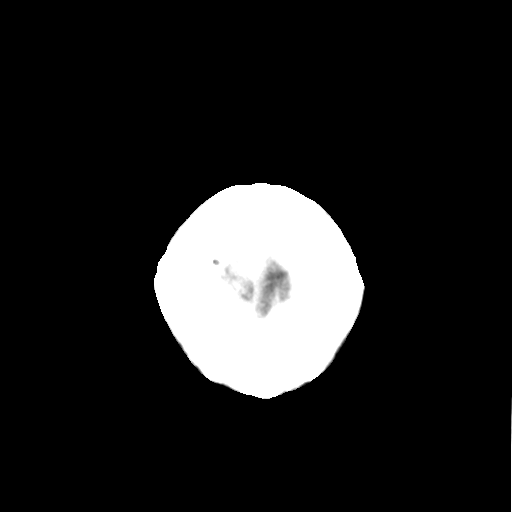
[im 29/31  brain]
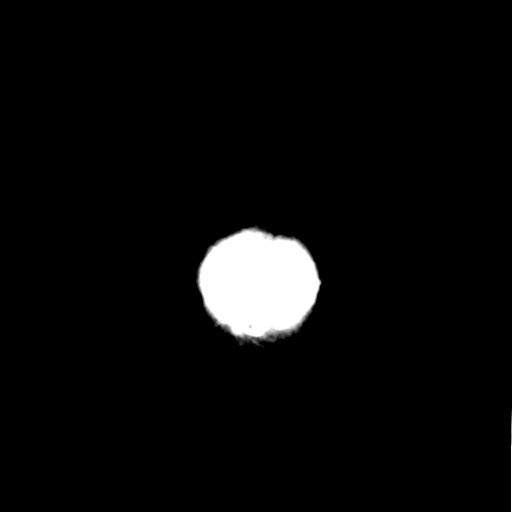

[16 of 30 positions shown; findings below may reference images not displayed]

FINDINGS: Brain: Appears normal without hemorrhage, infarct, mass lesion, mass
effect, midline shift or abnormal extra-axial fluid collection. No
hydrocephalus or pneumocephalus.

Vascular: Unremarkable.

Skull: Intact.

Sinuses/Orbits: Negative.

Other: None.
IMPRESSION: Normal head CT.

## 2017-08-17 DIAGNOSIS — Z01419 Encounter for gynecological examination (general) (routine) without abnormal findings: Secondary | ICD-10-CM | POA: Diagnosis not present

## 2017-09-02 DIAGNOSIS — N915 Oligomenorrhea, unspecified: Secondary | ICD-10-CM | POA: Diagnosis not present

## 2017-09-17 DIAGNOSIS — Z23 Encounter for immunization: Secondary | ICD-10-CM | POA: Diagnosis not present

## 2017-12-04 DIAGNOSIS — Z23 Encounter for immunization: Secondary | ICD-10-CM | POA: Diagnosis not present

## 2017-12-04 DIAGNOSIS — Z0000 Encounter for general adult medical examination without abnormal findings: Secondary | ICD-10-CM | POA: Diagnosis not present

## 2017-12-04 DIAGNOSIS — Z79899 Other long term (current) drug therapy: Secondary | ICD-10-CM | POA: Diagnosis not present

## 2018-07-20 DIAGNOSIS — H6091 Unspecified otitis externa, right ear: Secondary | ICD-10-CM | POA: Diagnosis not present

## 2018-08-24 DIAGNOSIS — Z23 Encounter for immunization: Secondary | ICD-10-CM | POA: Diagnosis not present

## 2018-09-02 DIAGNOSIS — Z01419 Encounter for gynecological examination (general) (routine) without abnormal findings: Secondary | ICD-10-CM | POA: Diagnosis not present

## 2018-09-02 DIAGNOSIS — Z6824 Body mass index (BMI) 24.0-24.9, adult: Secondary | ICD-10-CM | POA: Diagnosis not present

## 2018-09-15 DIAGNOSIS — R42 Dizziness and giddiness: Secondary | ICD-10-CM | POA: Diagnosis not present

## 2019-11-28 ENCOUNTER — Ambulatory Visit: Payer: 59 | Attending: Internal Medicine

## 2019-11-28 DIAGNOSIS — Z20822 Contact with and (suspected) exposure to covid-19: Secondary | ICD-10-CM

## 2019-11-30 LAB — NOVEL CORONAVIRUS, NAA: SARS-CoV-2, NAA: NOT DETECTED

## 2022-02-18 ENCOUNTER — Ambulatory Visit
Admission: RE | Admit: 2022-02-18 | Discharge: 2022-02-18 | Disposition: A | Discharge status: Home / Self Care | Payer: BC Managed Care – PPO | Source: Ambulatory Visit | Attending: Family Medicine | Admitting: Family Medicine

## 2022-02-18 ENCOUNTER — Other Ambulatory Visit: Payer: Self-pay | Admitting: Family Medicine

## 2022-02-18 DIAGNOSIS — M542 Cervicalgia: Secondary | ICD-10-CM

## 2022-12-17 ENCOUNTER — Other Ambulatory Visit (HOSPITAL_COMMUNITY): Payer: Self-pay

## 2022-12-17 MED ORDER — LISDEXAMFETAMINE DIMESYLATE 50 MG PO CAPS
50.0000 mg | ORAL_CAPSULE | Freq: Every morning | ORAL | 0 refills | Status: DC
  Filled 2022-12-17: qty 30, 30d supply, fill #0

## 2022-12-18 ENCOUNTER — Other Ambulatory Visit (HOSPITAL_COMMUNITY): Payer: Self-pay

## 2023-01-16 ENCOUNTER — Other Ambulatory Visit (HOSPITAL_COMMUNITY): Payer: Self-pay

## 2023-01-16 MED ORDER — LISDEXAMFETAMINE DIMESYLATE 50 MG PO CAPS
50.0000 mg | ORAL_CAPSULE | Freq: Every morning | ORAL | 0 refills | Status: DC
  Filled 2023-01-16: qty 30, 30d supply, fill #0

## 2023-01-19 ENCOUNTER — Other Ambulatory Visit (HOSPITAL_COMMUNITY): Payer: Self-pay

## 2023-01-26 ENCOUNTER — Other Ambulatory Visit (HOSPITAL_COMMUNITY): Payer: Self-pay

## 2023-01-26 MED ORDER — LISDEXAMFETAMINE DIMESYLATE 50 MG PO CAPS
50.0000 mg | ORAL_CAPSULE | ORAL | 0 refills | Status: AC
  Filled 2023-03-22: qty 30, 30d supply, fill #0

## 2023-01-26 MED ORDER — LISDEXAMFETAMINE DIMESYLATE 50 MG PO CAPS
50.0000 mg | ORAL_CAPSULE | Freq: Every morning | ORAL | 0 refills | Status: AC
  Filled 2023-06-18: qty 30, 30d supply, fill #0

## 2023-01-26 MED ORDER — LISDEXAMFETAMINE DIMESYLATE 50 MG PO CAPS
50.0000 mg | ORAL_CAPSULE | Freq: Every morning | ORAL | 0 refills | Status: AC
  Filled 2023-01-26 – 2023-02-18 (×2): qty 30, 30d supply, fill #0

## 2023-01-26 MED ORDER — TRINTELLIX 20 MG PO TABS
20.0000 mg | ORAL_TABLET | Freq: Every morning | ORAL | 1 refills | Status: DC
  Filled 2023-01-26 – 2023-02-27 (×2): qty 90, 90d supply, fill #0
  Filled 2023-05-29: qty 90, 90d supply, fill #1

## 2023-02-18 ENCOUNTER — Other Ambulatory Visit (HOSPITAL_COMMUNITY): Payer: Self-pay

## 2023-02-19 ENCOUNTER — Other Ambulatory Visit (HOSPITAL_COMMUNITY): Payer: Self-pay

## 2023-02-27 ENCOUNTER — Other Ambulatory Visit (HOSPITAL_COMMUNITY): Payer: Self-pay

## 2023-03-23 ENCOUNTER — Other Ambulatory Visit (HOSPITAL_COMMUNITY): Payer: Self-pay

## 2023-04-20 ENCOUNTER — Other Ambulatory Visit (HOSPITAL_COMMUNITY): Payer: Self-pay

## 2023-04-20 MED ORDER — LISDEXAMFETAMINE DIMESYLATE 50 MG PO CAPS
50.0000 mg | ORAL_CAPSULE | Freq: Every morning | ORAL | 0 refills | Status: AC
  Filled 2023-05-20: qty 30, 30d supply, fill #0

## 2023-04-20 MED ORDER — LISDEXAMFETAMINE DIMESYLATE 50 MG PO CAPS
50.0000 mg | ORAL_CAPSULE | Freq: Every morning | ORAL | 0 refills | Status: AC
  Filled 2023-08-11 – 2023-08-23 (×2): qty 30, 30d supply, fill #0

## 2023-04-20 MED ORDER — LISDEXAMFETAMINE DIMESYLATE 50 MG PO CAPS
50.0000 mg | ORAL_CAPSULE | Freq: Every morning | ORAL | 0 refills | Status: AC
  Filled 2023-04-20: qty 30, 30d supply, fill #0

## 2023-04-20 MED ORDER — TRINTELLIX 20 MG PO TABS
20.0000 mg | ORAL_TABLET | Freq: Every morning | ORAL | 1 refills | Status: DC
  Filled 2023-04-20: qty 90, 90d supply, fill #0

## 2023-04-22 ENCOUNTER — Other Ambulatory Visit (HOSPITAL_COMMUNITY): Payer: Self-pay

## 2023-05-12 ENCOUNTER — Other Ambulatory Visit (INDEPENDENT_AMBULATORY_CARE_PROVIDER_SITE_OTHER): Payer: BC Managed Care – PPO

## 2023-05-12 ENCOUNTER — Other Ambulatory Visit: Payer: Self-pay

## 2023-05-12 DIAGNOSIS — E282 Polycystic ovarian syndrome: Secondary | ICD-10-CM

## 2023-05-12 LAB — TSH: TSH: 1.12 u[IU]/mL (ref 0.35–5.50)

## 2023-05-12 LAB — COMPREHENSIVE METABOLIC PANEL
ALT: 9 U/L (ref 0–35)
AST: 13 U/L (ref 0–37)
Albumin: 4.3 g/dL (ref 3.5–5.2)
Alkaline Phosphatase: 43 U/L (ref 39–117)
BUN: 13 mg/dL (ref 6–23)
CO2: 27 mEq/L (ref 19–32)
Calcium: 9.4 mg/dL (ref 8.4–10.5)
Chloride: 101 mEq/L (ref 96–112)
Creatinine, Ser: 0.87 mg/dL (ref 0.40–1.20)
GFR: 86.2 mL/min (ref >60.00)
Glucose, Bld: 93 mg/dL (ref 70–99)
Potassium: 4.8 mEq/L (ref 3.5–5.1)
Sodium: 136 mEq/L (ref 135–145)
Total Bilirubin: 0.5 mg/dL (ref 0.2–1.2)
Total Protein: 6.8 g/dL (ref 6.0–8.3)

## 2023-05-12 LAB — LIPID PANEL
Cholesterol: 214 mg/dL — ABNORMAL HIGH (ref 0–200)
HDL: 89.3 mg/dL (ref >39.00)
LDL Cholesterol: 94 mg/dL (ref 0–99)
NonHDL: 125.15
Total CHOL/HDL Ratio: 2
Triglycerides: 154 mg/dL — ABNORMAL HIGH (ref 0.0–149.0)
VLDL: 30.8 mg/dL (ref 0.0–40.0)

## 2023-05-12 LAB — T4, FREE: Free T4: 0.79 ng/dL (ref 0.60–1.60)

## 2023-05-12 LAB — HEMOGLOBIN A1C: Hgb A1c MFr Bld: 5.5 % (ref 4.6–6.5)

## 2023-05-13 LAB — INSULIN, RANDOM: Insulin: 11.6 u[IU]/mL

## 2023-05-18 ENCOUNTER — Ambulatory Visit: Payer: BC Managed Care – PPO | Admitting: "Endocrinology

## 2023-05-18 ENCOUNTER — Encounter: Payer: Self-pay | Admitting: "Endocrinology

## 2023-05-18 VITALS — BP 115/80 | HR 89 | Ht 67.0 in | Wt 175.0 lb

## 2023-05-18 DIAGNOSIS — E282 Polycystic ovarian syndrome: Secondary | ICD-10-CM | POA: Diagnosis not present

## 2023-05-18 DIAGNOSIS — E782 Mixed hyperlipidemia: Secondary | ICD-10-CM | POA: Diagnosis not present

## 2023-05-18 DIAGNOSIS — E663 Overweight: Secondary | ICD-10-CM | POA: Diagnosis not present

## 2023-05-18 NOTE — Patient Instructions (Signed)
  Low Glycemic Index Carbohydrates (CHOOSE) Steel-cut oats (regular, not quick-cook or instant) Oat bran cereal Beans/legumes (e.g., garbanzo, navy, kidney, lima, pinto, black-eyed and pea beans, edamame (soybeans), lentils Bean products (e.g., hummus, tofu) Pearled barley, cooked al dente Yams Some fruits (e.g., grapefruit, apples, pears, berries, apricots, peaches) Some pasta (e.g., Barilla Plus pasta), cooked al dente Some whole grain breads (e.g., Ezekiel bread, Joseph's Flax, Oat Bran & Whole Wheat Pita/Lavash/Tortillas) Some whole grain crackers (e.g., RyKrisp, RyVita, Wasa) Brown rice, wild rice Quinoa  Buckwheat (a grass)   High Glycemic Index Carbohydrates (AVOID) Refined breakfast cereals (e.g., Corn Flakes, Rice Krispies, Cream of Rice, instant oatmeal) Regular pasta Most starchy vegetables (e.g., white potatoes, corn, winter (orange) squash) White rice, rice cakes Popcorn, pretzels, chips Some fruits (e.g., ripe bananas, pineapple, mango, watermelon, grapes) All fruit juices and sweetened drinks (e.g., sodas, sweetened iced tea) Bread, rolls, bagels, English muffins, and crackers made with refined flour Sweets (e.g., candy, cake, cookies, ice cream, syrup)   Heart-Healthy Fats (Adapt) Nuts, nut butters Avocado, guacamole Olives Most plant oils (e.g., olive, canola, peanut, soy, sunflower, sesame) Most seeds (e.g., sunflower, flax, sesame/sesame tahini) Oily fish (e.g., salmon, bluefish, mackerel, tuna, sardines)   

## 2023-05-18 NOTE — Progress Notes (Signed)
Outpatient Endocrinology Note Ashley [place], MD    Ashley Kennedy Sep 19, 1986 [id number]  Referring Provider: Lupita Raider, MD Primary Care Provider: Lupita Raider, MD Reason for consultation: Subjective   Assessment & Plan  [name] was seen today for pcos.  Diagnoses and all orders for this visit:  PCOS (polycystic ovarian syndrome)  Overweight  Mixed hypercholesterolemia and hypertriglyceridemia  Diagnosed of PCOS around 2022 by Ob-gyn  Criteria is met due to oligomenorrhea and hyperandrogenism (no labs but stated by patients). No clinical hyperandrogenism like hirsutism/clitoromegaly/deeper voice  Recommend follow-up with OB/GYN for change in birth control to minimize side effects Not interested in any kind of metformin  Recommended lifestyle changes for the patient to improve weight as well as high cholesterol and high triglycerides Provided a list of low glycemic index foods to use, avoid high glycemic index foods, and incorporate healthy fats Avoid red meats and fried foods Incorporate exercise as a daily routine  Patient wants to follow-up with the PCP Follow-up with me as needed  I have reviewed current medications, nurse's notes, allergies, vital signs, past medical and surgical history, family medical history, and social history for this encounter. Counseled patient on symptoms, examination findings, lab findings, imaging results, treatment decisions and monitoring and prognosis. The patient understood the recommendations and agrees with the treatment plan. All questions regarding treatment plan were fully answered.  Ashley [place], MD  05/18/23   History of Present Illness HPI  Ashley Kennedy is a 37 y.o. year old female who presents for evaluation of PCOS.  Diagnosed of PCOS around 2022 by Ob-gyn  Criteria is met due to oligomenorrhea and hyperandrogenism (no labs but stated by patients). No clinical hyperandrogenism like  hirsutism/clitoromegaly/deeper voice  Been on birth control since 60 due to irregular periods Has been trying to get off of it but doesn't get period without it-went 9 months without period off of birth control  Has abdominal cramps, nauseous, feels feverish, bloated few days before period  Has no kids, patient is not decisive about family   Watched diet and lifestyle   Not interested in any form of metformin due to sensitive stomach   Physical Exam  BP 115/80   Pulse 89   Ht 5\' 7"  (1.702 m)   Wt 175 lb (79.4 kg)   SpO2 99%   BMI 27.41 kg/m    Constitutional: well developed, well nourished Head: normocephalic, atraumatic Eyes: sclera anicteric, no redness Neck: supple Lungs: normal respiratory effort Neurology: alert and oriented Skin: dry, no appreciable rashes Musculoskeletal: no appreciable defects Psychiatric: normal mood and affect   Current Medications Patient's Medications  New Prescriptions   No medications on file  Previous Medications   ACIDOPHILUS (RISAQUAD) CAPS CAPSULE    Take 1 capsule by mouth daily.   AMOXICILLIN (AMOXIL) 500 MG CAPSULE    Take 500 mg by mouth 3 (three) times daily.   CHOLECALCIFEROL (VITAMIN D3) 25 MCG (1000 UNIT) TABLET    Take 2,000 Units by mouth daily.   DROSPIRENONE-ETHINYL ESTRADIOL (YAZ) 3-0.02 MG TABLET    Take 1 tablet by mouth daily.   HYOSCYAMINE (LEVSIN SL) 0.125 MG SL TABLET    Place 0.125-0.25 mg under the tongue every 4 (four) hours as needed for cramping.   LISDEXAMFETAMINE (VYVANSE) 50 MG CAPSULE    Take 1 capsule (50 mg total) by mouth every morning.   LISDEXAMFETAMINE (VYVANSE) 50 MG CAPSULE    Take 1 capsule (50 mg total) by mouth every morning.   LISDEXAMFETAMINE (VYVANSE)  50 MG CAPSULE    Take 1 capsule (50 mg total) by mouth every morning.   LISDEXAMFETAMINE (VYVANSE) 50 MG CAPSULE    Take 1 capsule (50 mg total) by mouth every morning (fill 06/15/23)   LISDEXAMFETAMINE (VYVANSE) 50 MG CAPSULE    Take 1 capsule (50  mg) by mouth every morning.   LISDEXAMFETAMINE (VYVANSE) 50 MG CAPSULE    Take 1 capsule (50 mg total) by mouth every morning (fill 05/18/23)   LORATADINE-PSEUDOEPHEDRINE (CLARITIN-D 12-HOUR) 5-120 MG TABLET    Take 1 tablet by mouth 2 (two) times daily.   MAGNESIUM 30 MG TABLET    Take 55 mg by mouth 2 (two) times daily.   OMEGA-3 ACID ETHYL ESTERS (LOVAZA) 1 G CAPSULE    Take 120 mg by mouth daily.   PANTOPRAZOLE (PROTONIX) 20 MG TABLET    Take 20 mg by mouth daily.   SPIRONOLACTONE (ALDACTONE) 100 MG TABLET    Take 100 mg by mouth daily.   VORTIOXETINE HBR (TRINTELLIX) 20 MG TABS TABLET    Take 1 tablet (20 mg total) by mouth every morning.  Modified Medications   No medications on file  Discontinued Medications   VORTIOXETINE HBR (TRINTELLIX) 20 MG TABS TABLET    Take 1 tablet (20 mg total) by mouth every morning.    Allergies Allergies  Allergen Reactions   Viibryd [Vilazodone Hcl] Rash    Past Medical History Past Medical History:  Diagnosis Date   Anxiety    IBS (irritable bowel syndrome)    Spastic colon     Past Surgical History Past Surgical History:  Procedure Laterality Date   BACK SURGERY     scoliosis repair   TONSILLECTOMY      Family History family history is not on file.  Social History Social History   Socioeconomic History   Marital status: Married    Spouse name: Not on file   Number of children: Not on file   Years of education: Not on file   Highest education level: Not on file  Occupational History   Not on file  Tobacco Use   Smoking status: Some Days    Types: Cigarettes   Smokeless tobacco: Not on file  Substance and Sexual Activity   Alcohol use: Yes    Comment: socially   Drug use: No   Sexual activity: Yes    Birth control/protection: Condom  Other Topics Concern   Not on file  Social History Narrative   Not on file   Social Determinants of Health   Financial Resource Strain: Not on file  Food Insecurity: Not on file   Transportation Needs: Not on file  Physical Activity: Not on file  Stress: Not on file  Social Connections: Not on file  Intimate Partner Violence: Not on file    Lab Results  Component Value Date   CHOL 214 (H) 05/12/2023   Lab Results  Component Value Date   HDL 89.30 05/12/2023   Lab Results  Component Value Date   LDLCALC 94 05/12/2023   Lab Results  Component Value Date   TRIG 154.0 (H) 05/12/2023   Lab Results  Component Value Date   CHOLHDL 2 05/12/2023   Lab Results  Component Value Date   CREATININE 0.87 05/12/2023   Lab Results  Component Value Date   GFR 86.20 05/12/2023      Component Value Date/Time   NA 136 05/12/2023 0849   K 4.8 05/12/2023 0849   CL 101 05/12/2023  0849   CO2 27 05/12/2023 0849   GLUCOSE 93 05/12/2023 0849   BUN 13 05/12/2023 0849   CREATININE 0.87 05/12/2023 0849   CALCIUM 9.4 05/12/2023 0849   PROT 6.8 05/12/2023 0849   ALBUMIN 4.3 05/12/2023 0849   AST 13 05/12/2023 0849   ALT 9 05/12/2023 0849   ALKPHOS 43 05/12/2023 0849   BILITOT 0.5 05/12/2023 0849   GFRNONAA 87 (L) 06/19/2014 1933   GFRAA >90 06/19/2014 1933      Latest Ref Rng & Units 05/12/2023    8:49 AM 06/19/2014    7:33 PM 04/19/2012    8:58 PM  BMP  Glucose 70 - 99 mg/dL 93  161  98   BUN 6 - 23 mg/dL 13  16  8    Creatinine 0.40 - 1.20 mg/dL 0.96  0.45  4.09   Sodium 135 - 145 mEq/L 136  138  140   Potassium 3.5 - 5.1 mEq/L 4.8  4.2  3.8   Chloride 96 - 112 mEq/L 101  98  104   CO2 19 - 32 mEq/L 27  25    Calcium 8.4 - 10.5 mg/dL 9.4  81.1         Component Value Date/Time   WBC 13.2 (H) 06/19/2014 1933   RBC 4.29 06/19/2014 1933   HGB 13.7 06/19/2014 1933   HCT 40.9 06/19/2014 1933   PLT 349 06/19/2014 1933   MCV 95.3 06/19/2014 1933   MCH 31.9 06/19/2014 1933   MCHC 33.5 06/19/2014 1933   RDW 13.1 06/19/2014 1933   LYMPHSABS 3.7 06/19/2014 1933   MONOABS 1.2 (H) 06/19/2014 1933   EOSABS 0.3 06/19/2014 1933   BASOSABS 0.1 06/19/2014 1933    Lab Results  Component Value Date   TSH 1.12 05/12/2023   FREET4 0.79 05/12/2023         Parts of this note may have been dictated using voice recognition software. There may be variances in spelling and vocabulary which are unintentional. Not all errors are proofread. Please notify the [facility] if any discrepancies are noted or if the meaning of any statement is not clear.

## 2023-05-20 ENCOUNTER — Other Ambulatory Visit (HOSPITAL_COMMUNITY): Payer: Self-pay

## 2023-05-29 ENCOUNTER — Other Ambulatory Visit (HOSPITAL_COMMUNITY): Payer: Self-pay

## 2023-06-18 ENCOUNTER — Other Ambulatory Visit: Payer: Self-pay

## 2023-06-18 ENCOUNTER — Other Ambulatory Visit (HOSPITAL_COMMUNITY): Payer: Self-pay

## 2023-07-13 ENCOUNTER — Other Ambulatory Visit (HOSPITAL_COMMUNITY): Payer: Self-pay

## 2023-07-13 MED ORDER — LISDEXAMFETAMINE DIMESYLATE 50 MG PO CAPS
50.0000 mg | ORAL_CAPSULE | Freq: Every morning | ORAL | 0 refills | Status: AC
  Filled 2023-10-27: qty 30, 30d supply, fill #0

## 2023-07-13 MED ORDER — LISDEXAMFETAMINE DIMESYLATE 50 MG PO CAPS
50.0000 mg | ORAL_CAPSULE | Freq: Every morning | ORAL | 0 refills | Status: AC
  Filled 2023-11-30: qty 30, 30d supply, fill #0

## 2023-07-13 MED ORDER — TRINTELLIX 20 MG PO TABS
20.0000 mg | ORAL_TABLET | Freq: Every morning | ORAL | 2 refills | Status: AC
  Filled 2023-07-13 – 2023-08-23 (×2): qty 30, 30d supply, fill #0
  Filled 2024-01-01: qty 60, 60d supply, fill #1

## 2023-07-13 MED ORDER — LISDEXAMFETAMINE DIMESYLATE 50 MG PO CAPS
50.0000 mg | ORAL_CAPSULE | Freq: Every morning | ORAL | 0 refills | Status: AC
  Filled 2023-07-22: qty 30, 30d supply, fill #0

## 2023-07-22 ENCOUNTER — Other Ambulatory Visit (HOSPITAL_COMMUNITY): Payer: Self-pay

## 2023-08-11 ENCOUNTER — Other Ambulatory Visit (HOSPITAL_COMMUNITY): Payer: Self-pay

## 2023-08-12 ENCOUNTER — Other Ambulatory Visit (HOSPITAL_COMMUNITY): Payer: Self-pay

## 2023-08-13 ENCOUNTER — Other Ambulatory Visit (HOSPITAL_COMMUNITY): Payer: Self-pay

## 2023-08-13 MED ORDER — LISDEXAMFETAMINE DIMESYLATE 50 MG PO CAPS
50.0000 mg | ORAL_CAPSULE | Freq: Every morning | ORAL | 0 refills | Status: DC
  Filled 2024-01-01: qty 30, 30d supply, fill #0

## 2023-08-23 ENCOUNTER — Other Ambulatory Visit (HOSPITAL_COMMUNITY): Payer: Self-pay

## 2023-08-24 ENCOUNTER — Other Ambulatory Visit (HOSPITAL_COMMUNITY): Payer: Self-pay

## 2023-08-28 ENCOUNTER — Other Ambulatory Visit (HOSPITAL_COMMUNITY): Payer: Self-pay

## 2023-09-20 ENCOUNTER — Other Ambulatory Visit (HOSPITAL_COMMUNITY): Payer: Self-pay

## 2023-09-20 MED ORDER — LISDEXAMFETAMINE DIMESYLATE 50 MG PO CAPS
50.0000 mg | ORAL_CAPSULE | Freq: Every morning | ORAL | 0 refills | Status: AC
  Filled 2023-09-21: qty 30, 30d supply, fill #0

## 2023-09-21 ENCOUNTER — Other Ambulatory Visit: Payer: Self-pay

## 2023-09-21 ENCOUNTER — Other Ambulatory Visit (HOSPITAL_COMMUNITY): Payer: Self-pay

## 2023-09-23 ENCOUNTER — Other Ambulatory Visit (HOSPITAL_COMMUNITY): Payer: Self-pay

## 2023-09-23 MED ORDER — TRINTELLIX 20 MG PO TABS
20.0000 mg | ORAL_TABLET | Freq: Every day | ORAL | 3 refills | Status: AC
  Filled 2023-09-23: qty 90, 90d supply, fill #0
  Filled 2023-12-22: qty 90, 90d supply, fill #1

## 2023-09-28 ENCOUNTER — Other Ambulatory Visit (HOSPITAL_COMMUNITY): Payer: Self-pay

## 2023-10-27 ENCOUNTER — Other Ambulatory Visit (HOSPITAL_COMMUNITY): Payer: Self-pay

## 2023-10-27 ENCOUNTER — Other Ambulatory Visit: Payer: Self-pay

## 2023-10-28 ENCOUNTER — Other Ambulatory Visit (HOSPITAL_COMMUNITY): Payer: Self-pay

## 2023-11-30 ENCOUNTER — Other Ambulatory Visit: Payer: Self-pay

## 2023-11-30 ENCOUNTER — Other Ambulatory Visit (HOSPITAL_COMMUNITY): Payer: Self-pay

## 2023-12-01 ENCOUNTER — Other Ambulatory Visit (HOSPITAL_COMMUNITY): Payer: Self-pay

## 2023-12-22 ENCOUNTER — Other Ambulatory Visit (HOSPITAL_COMMUNITY): Payer: Self-pay

## 2024-01-01 ENCOUNTER — Other Ambulatory Visit (HOSPITAL_BASED_OUTPATIENT_CLINIC_OR_DEPARTMENT_OTHER): Payer: Self-pay

## 2024-01-01 ENCOUNTER — Other Ambulatory Visit (HOSPITAL_COMMUNITY): Payer: Self-pay

## 2024-01-04 ENCOUNTER — Other Ambulatory Visit (HOSPITAL_COMMUNITY): Payer: Self-pay

## 2024-02-02 ENCOUNTER — Ambulatory Visit: Payer: BC Managed Care – PPO

## 2024-03-09 ENCOUNTER — Other Ambulatory Visit (HOSPITAL_COMMUNITY): Payer: Self-pay

## 2024-03-10 ENCOUNTER — Other Ambulatory Visit (HOSPITAL_COMMUNITY): Payer: Self-pay

## 2024-03-10 ENCOUNTER — Other Ambulatory Visit: Payer: Self-pay

## 2024-03-10 MED ORDER — MIFEPRISTONE 200 MG PO TABS: ORAL_TABLET | ORAL | 0 refills | Status: AC

## 2024-03-10 MED ORDER — OXYCODONE HCL 5 MG PO TABS
5.0000 mg | ORAL_TABLET | ORAL | 0 refills | Status: DC
  Filled 2024-03-10: qty 10, 2d supply, fill #0

## 2024-03-10 MED ORDER — TRINTELLIX 5 MG PO TABS
5.0000 mg | ORAL_TABLET | Freq: Every day | ORAL | 1 refills | Status: AC
  Filled 2024-03-10: qty 90, 90d supply, fill #0
  Filled 2024-06-08: qty 90, 90d supply, fill #1

## 2024-03-10 MED ORDER — LISDEXAMFETAMINE DIMESYLATE 50 MG PO CAPS
50.0000 mg | ORAL_CAPSULE | Freq: Every morning | ORAL | 0 refills | Status: DC
  Filled 2024-03-10: qty 30, 30d supply, fill #0

## 2024-03-14 ENCOUNTER — Other Ambulatory Visit (HOSPITAL_COMMUNITY): Payer: Self-pay

## 2024-04-06 ENCOUNTER — Other Ambulatory Visit (HOSPITAL_COMMUNITY): Payer: Self-pay

## 2024-04-06 MED ORDER — CYCLOBENZAPRINE HCL 5 MG PO TABS
5.0000 mg | ORAL_TABLET | Freq: Three times a day (TID) | ORAL | 0 refills | Status: AC | PRN
  Filled 2024-04-06: qty 30, 10d supply, fill #0

## 2024-04-07 ENCOUNTER — Other Ambulatory Visit (HOSPITAL_COMMUNITY): Payer: Self-pay

## 2024-04-18 ENCOUNTER — Other Ambulatory Visit (HOSPITAL_COMMUNITY): Payer: Self-pay

## 2024-04-18 MED ORDER — LISDEXAMFETAMINE DIMESYLATE 50 MG PO CAPS
50.0000 mg | ORAL_CAPSULE | Freq: Every day | ORAL | 0 refills | Status: DC
  Filled 2024-04-18: qty 30, 30d supply, fill #0

## 2024-05-25 ENCOUNTER — Other Ambulatory Visit (HOSPITAL_COMMUNITY): Payer: Self-pay

## 2024-05-25 MED ORDER — LISDEXAMFETAMINE DIMESYLATE 50 MG PO CAPS
50.0000 mg | ORAL_CAPSULE | Freq: Every morning | ORAL | 0 refills | Status: DC
  Filled 2024-05-25: qty 30, 30d supply, fill #0

## 2024-06-01 ENCOUNTER — Other Ambulatory Visit (HOSPITAL_COMMUNITY): Payer: Self-pay

## 2024-06-27 ENCOUNTER — Other Ambulatory Visit (HOSPITAL_COMMUNITY): Payer: Self-pay

## 2024-06-28 ENCOUNTER — Other Ambulatory Visit (HOSPITAL_COMMUNITY): Payer: Self-pay

## 2024-06-28 MED ORDER — LISDEXAMFETAMINE DIMESYLATE 50 MG PO CAPS
50.0000 mg | ORAL_CAPSULE | Freq: Every morning | ORAL | 0 refills | Status: DC
  Filled 2024-06-28: qty 30, 30d supply, fill #0

## 2024-07-29 ENCOUNTER — Other Ambulatory Visit (HOSPITAL_COMMUNITY): Payer: Self-pay

## 2024-07-29 MED ORDER — LISDEXAMFETAMINE DIMESYLATE 50 MG PO CAPS
50.0000 mg | ORAL_CAPSULE | Freq: Every day | ORAL | 0 refills | Status: DC
  Filled 2024-07-29: qty 30, 30d supply, fill #0

## 2024-08-19 ENCOUNTER — Other Ambulatory Visit (HOSPITAL_COMMUNITY): Payer: Self-pay

## 2024-08-19 MED ORDER — CIPROFLOXACIN HCL 500 MG PO TABS
500.0000 mg | ORAL_TABLET | Freq: Two times a day (BID) | ORAL | 0 refills | Status: AC
  Filled 2024-08-19: qty 6, 3d supply, fill #0

## 2024-08-25 ENCOUNTER — Other Ambulatory Visit (HOSPITAL_COMMUNITY): Payer: Self-pay

## 2024-08-26 ENCOUNTER — Other Ambulatory Visit (HOSPITAL_COMMUNITY): Payer: Self-pay

## 2024-08-26 MED ORDER — LISDEXAMFETAMINE DIMESYLATE 50 MG PO CAPS
50.0000 mg | ORAL_CAPSULE | Freq: Every morning | ORAL | 0 refills | Status: DC
  Filled 2024-08-26: qty 30, 30d supply, fill #0

## 2024-09-08 ENCOUNTER — Other Ambulatory Visit (HOSPITAL_COMMUNITY): Payer: Self-pay

## 2024-09-08 MED ORDER — TRINTELLIX 10 MG PO TABS
10.0000 mg | ORAL_TABLET | Freq: Every day | ORAL | 3 refills | Status: DC
  Filled 2024-09-08: qty 30, 30d supply, fill #0

## 2024-09-09 ENCOUNTER — Other Ambulatory Visit (HOSPITAL_COMMUNITY): Payer: Self-pay

## 2024-09-09 MED ORDER — TRINTELLIX 10 MG PO TABS
10.0000 mg | ORAL_TABLET | Freq: Every day | ORAL | 11 refills | Status: AC
  Filled 2024-09-09 (×2): qty 30, 30d supply, fill #0
  Filled 2024-10-05: qty 30, 30d supply, fill #1
  Filled 2024-11-07: qty 30, 30d supply, fill #2
  Filled 2024-12-04: qty 30, 30d supply, fill #3

## 2024-09-10 ENCOUNTER — Other Ambulatory Visit (HOSPITAL_COMMUNITY): Payer: Self-pay

## 2024-09-19 ENCOUNTER — Other Ambulatory Visit (HOSPITAL_COMMUNITY): Payer: Self-pay

## 2024-09-19 MED ORDER — TRANEXAMIC ACID 650 MG PO TABS
1300.0000 mg | ORAL_TABLET | Freq: Three times a day (TID) | ORAL | 1 refills | Status: AC
  Filled 2024-09-19 – 2024-09-30 (×2): qty 30, 5d supply, fill #0

## 2024-09-28 ENCOUNTER — Other Ambulatory Visit (HOSPITAL_COMMUNITY): Payer: Self-pay

## 2024-09-28 MED ORDER — LISDEXAMFETAMINE DIMESYLATE 50 MG PO CAPS
50.0000 mg | ORAL_CAPSULE | Freq: Every morning | ORAL | 0 refills | Status: DC
  Filled 2024-09-28: qty 30, 30d supply, fill #0

## 2024-09-29 ENCOUNTER — Other Ambulatory Visit (HOSPITAL_COMMUNITY): Payer: Self-pay

## 2024-09-30 ENCOUNTER — Other Ambulatory Visit (HOSPITAL_COMMUNITY): Payer: Self-pay

## 2024-10-06 ENCOUNTER — Other Ambulatory Visit (HOSPITAL_COMMUNITY): Payer: Self-pay

## 2024-10-06 MED ORDER — AZITHROMYCIN 250 MG PO TABS
ORAL_TABLET | ORAL | 0 refills | Status: AC
Start: 2024-10-06 — End: ?
  Filled 2024-10-06: qty 6, 5d supply, fill #0

## 2024-10-12 ENCOUNTER — Ambulatory Visit: Admitting: "Endocrinology

## 2024-10-12 ENCOUNTER — Encounter: Payer: Self-pay | Admitting: "Endocrinology

## 2024-10-12 VITALS — BP 120/80 | HR 56 | Ht 67.0 in | Wt 165.0 lb

## 2024-10-12 DIAGNOSIS — E221 Hyperprolactinemia: Secondary | ICD-10-CM | POA: Diagnosis not present

## 2024-10-12 DIAGNOSIS — E663 Overweight: Secondary | ICD-10-CM | POA: Diagnosis not present

## 2024-10-12 DIAGNOSIS — E7800 Pure hypercholesterolemia, unspecified: Secondary | ICD-10-CM

## 2024-10-12 NOTE — Progress Notes (Signed)
 Outpatient Endocrinology Note Ashley [place], MD    Ashley Kennedy 08-12-1986 [id number]  Referring Provider: Loreli Kins, MD Primary Care Provider: Loreli Kins, MD Reason for consultation: Subjective   Assessment & Plan  Diagnoses and all orders for this visit:  Hyperprolactinemia -     Prolactin  Pure hypercholesterolemia  Overweight   Hyperprolactinemia, confirmed by 2 elevated prolactin in 08/2024 Patient is on antidepressant which can rarely elevate high prolactin, patient is not able to stop the pill and repeat the levels to confirm that etiology Patient has no concerning headache/vision changes or any breast discharge, has baseline oligomenorrhea Since patient is planning to attempt for pregnancy, there is no indication for any treatment as pregnancy will naturally elevate prolactin and cabergoline is not indicated in pregnancy even if it were pituitary adenoma related hyperprolactinemia  Very low risk for any pituitary adenoma given lack of symptoms and incidental mildly elevated prolactin on a medication that raises it (Vortioxetine ), hence no MRI is indicated at this time Patient will follow-up every 6 months Discussed methods to improve success rate for pregnancy including psych follow-up/ovulation detection naturally and through the kits  Diagnosed of PCOS around 2022 by Ob-gyn, last testosterone WNL Criteria is met due to oligomenorrhea and hyperandrogenism (no labs but stated by patient). No clinical hyperandrogenism like hirsutism/clitoromegaly/deeper voice  Recommend follow-up with OB/GYN for change in birth control to minimize side effects Was not interested in any kind of metformin 08/2024 A1C 5.3   Recommended lifestyle changes for the patient to improve weight (reports good muscle mass) as well as high cholesterol/LDL, Triglycerides are now normal  Previously: Provided a list of low glycemic index foods to use, avoid high glycemic index foods,  and incorporate healthy fats Avoid red meats and fried foods Incorporate exercise as a daily routine  Patient wants to follow-up with the PCP Follow-up with me as needed  I have reviewed current medications, nurse's notes, allergies, vital signs, past medical and surgical history, family medical history, and social history for this encounter. Counseled patient on symptoms, examination findings, lab findings, imaging results, treatment decisions and monitoring and prognosis. The patient understood the recommendations and agrees with the treatment plan. All questions regarding treatment plan were fully answered.  Ashley [place], MD  10/12/24   History of Present Illness HPI  Ashley Kennedy is a 38 y.o. year old female who presents for evaluation of PCOS.  Patient is planning to start pregnancy Sent here by her OB/GYN after she had 2 elevated readings of prolactin in 08/2024 Patient works out under a trainer 3 times a week Patient has no breast discharge, nor any concerning headaches or vision changes Patient follows up with her OB/GYN/PCP  Initial history  Diagnosed of PCOS around 2022 by Ob-gyn  Criteria is met due to oligomenorrhea and hyperandrogenism (no labs but stated by patients). No clinical hyperandrogenism like hirsutism/clitoromegaly/deeper voice  Been on birth control since 38 due to irregular periods Has been trying to get off of it but doesn't get period without it-went 9 months without period off of birth control  Has abdominal cramps, nauseous, feels feverish, bloated few days before period  Has no kids, patient is not decisive about family   Watched diet and lifestyle   Not interested in any form of metformin due to sensitive stomach   09/20/2024 Prolactin 43  09/22/24 prolactin 50.2 04/07/24 Chol 204 LDL 122 GFR 84 AST 14 ALT 9   Physical Exam  BP 120/80   Pulse ROLLEN)  56   Ht 5' 7 (1.702 m)   Wt 165 lb (74.8 kg)   SpO2 99%   BMI 25.84 kg/m     Constitutional: well developed, well nourished Head: normocephalic, atraumatic Eyes: sclera anicteric, no redness Neck: supple Lungs: normal respiratory effort Neurology: alert and oriented Skin: dry, no appreciable rashes Musculoskeletal: no appreciable defects Psychiatric: normal mood and affect   Current Medications Patient's Medications  New Prescriptions   No medications on file  Previous Medications   ACIDOPHILUS (RISAQUAD) CAPS CAPSULE    Take 1 capsule by mouth daily.   AMOXICILLIN (AMOXIL) 500 MG CAPSULE    Take 500 mg by mouth 3 (three) times daily.   AZITHROMYCIN (ZITHROMAX) 250 MG TABLET    Take 2 tablets on the first day, then 1 tablet daily for 4 days   CHOLECALCIFEROL (VITAMIN D3) 25 MCG (1000 UNIT) TABLET    Take 2,000 Units by mouth daily.   CIPROFLOXACIN  (CIPRO ) 500 MG TABLET    Take 1 tablet (500 mg total) by mouth every 12 (twelve) hours.   CYCLOBENZAPRINE  (FLEXERIL ) 5 MG TABLET    Take 1 tablet (5 mg total) by mouth 3 (three) times daily as needed.   DROSPIRENONE-ETHINYL ESTRADIOL (YAZ) 3-0.02 MG TABLET    Take 1 tablet by mouth daily.   HYOSCYAMINE (LEVSIN SL) 0.125 MG SL TABLET    Place 0.125-0.25 mg under the tongue every 4 (four) hours as needed for cramping.   LISDEXAMFETAMINE (VYVANSE ) 50 MG CAPSULE    Take 1 capsule (50 mg total) by mouth every morning.   LISDEXAMFETAMINE (VYVANSE ) 50 MG CAPSULE    Take 1 capsule (50 mg total) by mouth every morning.   LISDEXAMFETAMINE (VYVANSE ) 50 MG CAPSULE    Take 1 capsule (50 mg total) by mouth every morning.   LISDEXAMFETAMINE (VYVANSE ) 50 MG CAPSULE    Take 1 capsule (50 mg total) by mouth every morning.   LISDEXAMFETAMINE (VYVANSE ) 50 MG CAPSULE    Take 1 capsule (50 mg) by mouth every morning.   LISDEXAMFETAMINE (VYVANSE ) 50 MG CAPSULE    Take 1 capsule (50 mg total) by mouth every morning (fill 05/18/23)   LISDEXAMFETAMINE (VYVANSE ) 50 MG CAPSULE    Take 1 capsule (50 mg total) by mouth every morning. (fill  09/10/23)   LISDEXAMFETAMINE (VYVANSE ) 50 MG CAPSULE    Take 1 capsule (50 mg total) by mouth every morning. (fill 08/13/23)   LISDEXAMFETAMINE (VYVANSE ) 50 MG CAPSULE    Take 1 capsule (50 mg total) by mouth every morning. (fill 07/16/23)   LISDEXAMFETAMINE (VYVANSE ) 50 MG CAPSULE    Take 1 capsule (50 mg total) by mouth in the morning.   LISDEXAMFETAMINE (VYVANSE ) 50 MG CAPSULE    Take 1 capsule (50 mg total) by mouth in the morning.   LORATADINE-PSEUDOEPHEDRINE (CLARITIN-D 12-HOUR) 5-120 MG TABLET    Take 1 tablet by mouth 2 (two) times daily.   MAGNESIUM 30 MG TABLET    Take 55 mg by mouth 2 (two) times daily.   MIFEPRISTONE  (MIFEPREX ) 200 MG TABS    Take 1 tablet by oral route.   OMEGA-3 ACID ETHYL ESTERS (LOVAZA) 1 G CAPSULE    Take 120 mg by mouth daily.   PANTOPRAZOLE (PROTONIX) 20 MG TABLET    Take 20 mg by mouth daily.   SPIRONOLACTONE (ALDACTONE) 100 MG TABLET    Take 100 mg by mouth daily.   TRANEXAMIC ACID (LYSTEDA) 650 MG TABS TABLET    Take 2 tablets by  mouth 3 times a day for 5 days.   VORTIOXETINE  HBR (TRINTELLIX ) 10 MG TABS TABLET    Take 1 tablet (10 mg total) by mouth daily.   VORTIOXETINE  HBR (TRINTELLIX ) 20 MG TABS TABLET    Take 1 tablet (20 mg) by mouth every morning.   VORTIOXETINE  HBR (TRINTELLIX ) 20 MG TABS TABLET    Take 1 tablet (20 mg total) by mouth daily.   VORTIOXETINE  HBR (TRINTELLIX ) 5 MG TABS TABLET    Take 1 tablet (5 mg total) by mouth daily.  Modified Medications   No medications on file  Discontinued Medications   No medications on file    Allergies Allergies  Allergen Reactions   Viibryd [Vilazodone Hcl] Rash    Past Medical History Past Medical History:  Diagnosis Date   Anxiety    IBS (irritable bowel syndrome)    Spastic colon     Past Surgical History Past Surgical History:  Procedure Laterality Date   BACK SURGERY     scoliosis repair   TONSILLECTOMY      Family History family history is not on file.  Social History Social  History   Socioeconomic History   Marital status: Married    Spouse name: Not on file   Number of children: Not on file   Years of education: Not on file   Highest education level: Not on file  Occupational History   Not on file  Tobacco Use   Smoking status: Some Days    Types: Cigarettes   Smokeless tobacco: Not on file  Substance and Sexual Activity   Alcohol use: Yes    Comment: socially   Drug use: No   Sexual activity: Yes    Birth control/protection: Condom  Other Topics Concern   Not on file  Social History Narrative   Not on file   Social Drivers of Health   Financial Resource Strain: Not on file  Food Insecurity: Not on file  Transportation Needs: Not on file  Physical Activity: Not on file  Stress: Not on file  Social Connections: Not on file  Intimate Partner Violence: Not on file    Lab Results  Component Value Date   CHOL 214 (H) 05/12/2023   Lab Results  Component Value Date   HDL 89.30 05/12/2023   Lab Results  Component Value Date   LDLCALC 94 05/12/2023   Lab Results  Component Value Date   TRIG 154.0 (H) 05/12/2023   Lab Results  Component Value Date   CHOLHDL 2 05/12/2023   Lab Results  Component Value Date   CREATININE 0.87 05/12/2023   Lab Results  Component Value Date   GFR 86.20 05/12/2023      Component Value Date/Time   NA 136 05/12/2023 0849   K 4.8 05/12/2023 0849   CL 101 05/12/2023 0849   CO2 27 05/12/2023 0849   GLUCOSE 93 05/12/2023 0849   BUN 13 05/12/2023 0849   CREATININE 0.87 05/12/2023 0849   CALCIUM 9.4 05/12/2023 0849   PROT 6.8 05/12/2023 0849   ALBUMIN 4.3 05/12/2023 0849   AST 13 05/12/2023 0849   ALT 9 05/12/2023 0849   ALKPHOS 43 05/12/2023 0849   BILITOT 0.5 05/12/2023 0849   GFRNONAA 87 (L) 06/19/2014 1933   GFRAA >90 06/19/2014 1933      Latest Ref Rng & Units 05/12/2023    8:49 AM 06/19/2014    7:33 PM 04/19/2012    8:58 PM  BMP  Glucose 70 - 99  mg/dL 93  899  98   BUN 6 - 23 mg/dL  13  16  8    Creatinine 0.40 - 1.20 mg/dL 9.12  9.09  9.19   Sodium 135 - 145 mEq/L 136  138  140   Potassium 3.5 - 5.1 mEq/L 4.8  4.2  3.8   Chloride 96 - 112 mEq/L 101  98  104   CO2 19 - 32 mEq/L 27  25    Calcium 8.4 - 10.5 mg/dL 9.4  89.9         Component Value Date/Time   WBC 13.2 (H) 06/19/2014 1933   RBC 4.29 06/19/2014 1933   HGB 13.7 06/19/2014 1933   HCT 40.9 06/19/2014 1933   PLT 349 06/19/2014 1933   MCV 95.3 06/19/2014 1933   MCH 31.9 06/19/2014 1933   MCHC 33.5 06/19/2014 1933   RDW 13.1 06/19/2014 1933   LYMPHSABS 3.7 06/19/2014 1933   MONOABS 1.2 (H) 06/19/2014 1933   EOSABS 0.3 06/19/2014 1933   BASOSABS 0.1 06/19/2014 1933   Lab Results  Component Value Date   TSH 1.12 05/12/2023   FREET4 0.79 05/12/2023         Parts of this note may have been dictated using voice recognition software. There may be variances in spelling and vocabulary which are unintentional. Not all errors are proofread. Please notify the [facility]ino if any discrepancies are noted or if the meaning of any statement is not clear.

## 2024-10-13 ENCOUNTER — Other Ambulatory Visit (HOSPITAL_COMMUNITY): Payer: Self-pay

## 2024-10-13 MED ORDER — CABERGOLINE 0.5 MG PO TABS
ORAL_TABLET | ORAL | 2 refills | Status: DC
  Filled 2024-10-13: qty 8, 42d supply, fill #0
  Filled 2024-11-28: qty 8, 14d supply, fill #1

## 2024-10-26 ENCOUNTER — Other Ambulatory Visit (HOSPITAL_COMMUNITY): Payer: Self-pay

## 2024-10-26 MED ORDER — LISDEXAMFETAMINE DIMESYLATE 50 MG PO CAPS
50.0000 mg | ORAL_CAPSULE | Freq: Every morning | ORAL | 0 refills | Status: DC
  Filled 2024-10-31: qty 30, 30d supply, fill #0

## 2024-10-28 ENCOUNTER — Other Ambulatory Visit (HOSPITAL_COMMUNITY): Payer: Self-pay

## 2024-10-31 ENCOUNTER — Other Ambulatory Visit (HOSPITAL_COMMUNITY): Payer: Self-pay

## 2024-11-28 ENCOUNTER — Other Ambulatory Visit: Payer: Self-pay

## 2024-11-28 ENCOUNTER — Other Ambulatory Visit (HOSPITAL_COMMUNITY): Payer: Self-pay

## 2024-11-29 ENCOUNTER — Other Ambulatory Visit: Payer: Self-pay

## 2024-11-29 ENCOUNTER — Other Ambulatory Visit (HOSPITAL_COMMUNITY): Payer: Self-pay

## 2024-11-29 MED ORDER — LISDEXAMFETAMINE DIMESYLATE 50 MG PO CAPS
50.0000 mg | ORAL_CAPSULE | Freq: Every morning | ORAL | 0 refills | Status: AC
  Filled 2024-11-29: qty 30, 30d supply, fill #0

## 2024-12-05 ENCOUNTER — Other Ambulatory Visit: Payer: Self-pay

## 2024-12-08 ENCOUNTER — Other Ambulatory Visit (HOSPITAL_COMMUNITY): Payer: Self-pay

## 2024-12-30 ENCOUNTER — Other Ambulatory Visit (HOSPITAL_COMMUNITY): Payer: Self-pay

## 2024-12-30 MED ORDER — DOXYLAMINE-PYRIDOXINE 10-10 MG PO TBEC
DELAYED_RELEASE_TABLET | ORAL | 0 refills | Status: AC
  Filled 2024-12-30: qty 120, 30d supply, fill #0

## 2024-12-31 ENCOUNTER — Other Ambulatory Visit (HOSPITAL_COMMUNITY): Payer: Self-pay

## 2024-12-31 MED ORDER — TRANEXAMIC ACID 650 MG PO TABS
1300.0000 mg | ORAL_TABLET | Freq: Three times a day (TID) | ORAL | 1 refills | Status: AC
  Filled 2024-12-31: qty 30, 5d supply, fill #0

## 2025-01-05 ENCOUNTER — Other Ambulatory Visit (HOSPITAL_COMMUNITY): Payer: Self-pay
